# Patient Record
Sex: Male | Born: 1950 | Race: White | Hispanic: No | Marital: Married | Smoking: Never smoker
Health system: Southern US, Community
[De-identification: ages and names within clinical notes are randomized; demographics above are authoritative.]

## PROBLEM LIST (undated history)

## (undated) HISTORY — PX: KNEE ARTHROSCOPY: SUR90

## (undated) HISTORY — PX: TONSILLECTOMY: SUR1361

## (undated) HISTORY — PX: CATARACT EXTRACTION: SUR2

---

## 2021-06-20 DIAGNOSIS — C642 Malignant neoplasm of left kidney, except renal pelvis: Secondary | ICD-10-CM

## 2021-06-20 HISTORY — DX: Malignant neoplasm of left kidney, except renal pelvis: C64.2

## 2021-11-17 ENCOUNTER — Other Ambulatory Visit: Payer: Self-pay | Admitting: Neurological Surgery

## 2021-11-17 ENCOUNTER — Ambulatory Visit
Admission: RE | Admit: 2021-11-17 | Discharge: 2021-11-17 | Disposition: A | Discharge status: Home / Self Care | Payer: Medicare Other | Source: Ambulatory Visit | Attending: Neurological Surgery | Admitting: Neurological Surgery

## 2021-11-17 DIAGNOSIS — M4850XS Collapsed vertebra, not elsewhere classified, site unspecified, sequela of fracture: Secondary | ICD-10-CM

## 2021-11-18 ENCOUNTER — Other Ambulatory Visit: Payer: Self-pay | Admitting: Neurological Surgery

## 2021-11-18 NOTE — Pre-Procedure Instructions (Signed)
Surgical Instructions    Your procedure is scheduled on 11/23/21.  Report to [facility] Main Entrance "A" at [email or web address] then check in with the Admitting office.  Call this number if you have problems the morning of surgery:  [phone]   If you have any questions prior to your surgery date call [phone]: Open Monday-Friday 8am-4pm    Remember:  Do not eat or drink after midnight the night before your surgery    Take these medicines the morning of surgery with A SIP OF WATER:  N/A  As of today, STOP taking any Aspirin (unless otherwise instructed by your surgeon) Aleve, Naproxen, Ibuprofen, Motrin, Advil, Goody's, BC's, all herbal medications, fish oil, and all vitamins.           Do not wear jewelry or makeup Do not wear lotions, powders, perfumes/colognes, or deodorant. Do not shave 48 hours prior to surgery.  Men may shave face and neck. Do not bring valuables to the hospital. Do not wear nail polish, gel polish, artificial nails, or any other type of covering on natural nails (fingers and toes) If you have artificial nails or gel coating that need to be removed by a nail salon, please have this removed prior to surgery. Artificial nails or gel coating may interfere with anesthesia's ability to adequately monitor your vital signs.  [facility] is not responsible for any belongings or valuables. .   Do NOT Smoke (Tobacco/Vaping)  24 hours prior to your procedure  If you use a CPAP at night, you may bring your mask for your overnight stay.   Contacts, glasses, hearing aids, dentures or partials may not be worn into surgery, please bring cases for these belongings   For patients admitted to the hospital, discharge time will be determined by your treatment team.   Patients discharged the day of surgery will not be allowed to drive home, and someone needs to stay with them for 24 hours.   SURGICAL WAITING ROOM VISITATION Patients having surgery or a procedure in a hospital  may have two support people. Children under the age of 27 must have an adult with them who is not the patient. They may stay in the waiting area during the procedure and may switch out with other visitors. If the patient needs to stay at the hospital during part of their recovery, the visitor guidelines for inpatient rooms apply.  Please refer to the [facility] website for the visitor guidelines for Inpatients (after your surgery is over and you are in a regular room).    Special instructions:    Oral Hygiene is also important to reduce your risk of infection.  Remember - BRUSH YOUR TEETH THE MORNING OF SURGERY WITH YOUR REGULAR TOOTHPASTE   - Preparing For Surgery  Before surgery, you can play an important role. Because skin is not sterile, your skin needs to be as free of germs as possible. You can reduce the number of germs on your skin by washing with CHG (chlorahexidine gluconate) Soap before surgery.  CHG is an antiseptic cleaner which kills germs and bonds with the skin to continue killing germs even after washing.     Please do not use if you have an allergy to CHG or antibacterial soaps. If your skin becomes reddened/irritated stop using the CHG.  Do not shave (including legs and underarms) for at least 48 hours prior to first CHG shower. It is OK to shave your face.  Please follow these instructions carefully.  Shower the NIGHT BEFORE SURGERY and the MORNING OF SURGERY with CHG Soap.   If you chose to wash your hair, wash your hair first as usual with your normal shampoo. After you shampoo, rinse your hair and body thoroughly to remove the shampoo.  Then [employer] and genitals (private parts) with your normal soap and rinse thoroughly to remove soap.  After that Use CHG Soap as you would any other liquid soap. You can apply CHG directly to the skin and wash gently with a scrungie or a clean washcloth.   Apply the CHG Soap to your body ONLY FROM THE NECK DOWN.  Do  not use on open wounds or open sores. Avoid contact with your eyes, ears, mouth and genitals (private parts). Wash Face and genitals (private parts)  with your normal soap.   Wash thoroughly, paying special attention to the area where your surgery will be performed.  Thoroughly rinse your body with warm water from the neck down.  DO NOT shower/wash with your normal soap after using and rinsing off the CHG Soap.  Pat yourself dry with a CLEAN TOWEL.  Wear CLEAN PAJAMAS to bed the night before surgery  Place CLEAN SHEETS on your bed the night before your surgery  DO NOT SLEEP WITH PETS.   Day of Surgery:  Take a shower with CHG soap. Wear Clean/Comfortable clothing the morning of surgery Do not apply any deodorants/lotions.   Remember to brush your teeth WITH YOUR REGULAR TOOTHPASTE.   If you received a COVID test during your pre-op visit, it is requested that you wear a mask when out in public, stay away from anyone that may not be feeling well, and notify your surgeon if you develop symptoms. If you have been in contact with anyone that has tested positive in the last 10 days, please notify your surgeon.    Please read over the following fact sheets that you were given.

## 2021-11-19 ENCOUNTER — Encounter (HOSPITAL_COMMUNITY)
Admission: RE | Admit: 2021-11-19 | Discharge: 2021-11-19 | Disposition: A | Discharge status: Home / Self Care | Payer: Medicare Other | Source: Ambulatory Visit | Attending: Neurological Surgery | Admitting: Neurological Surgery

## 2021-11-19 ENCOUNTER — Other Ambulatory Visit: Payer: Self-pay

## 2021-11-19 ENCOUNTER — Encounter (HOSPITAL_COMMUNITY): Payer: Self-pay

## 2021-11-19 VITALS — BP 156/84 | HR 58 | Temp 98.3°F | Resp 17 | Ht 70.0 in | Wt 188.6 lb

## 2021-11-19 DIAGNOSIS — I10 Essential (primary) hypertension: Secondary | ICD-10-CM | POA: Insufficient documentation

## 2021-11-19 DIAGNOSIS — Z01818 Encounter for other preprocedural examination: Secondary | ICD-10-CM | POA: Diagnosis present

## 2021-11-19 LAB — BASIC METABOLIC PANEL
Anion gap: 11 (ref 5–15)
BUN: 27 mg/dL — ABNORMAL HIGH (ref 8–23)
CO2: 26 mmol/L (ref 22–32)
Calcium: 9.3 mg/dL (ref 8.9–10.3)
Chloride: 98 mmol/L (ref 98–111)
Creatinine, Ser: 1.23 mg/dL (ref 0.61–1.24)
GFR, Estimated: >60 mL/min (ref >60)
Glucose, Bld: 148 mg/dL — ABNORMAL HIGH (ref 70–99)
Potassium: 4 mmol/L (ref 3.5–5.1)
Sodium: 135 mmol/L (ref 135–145)

## 2021-11-19 LAB — TYPE AND SCREEN
ABO/RH(D): O POS
Antibody Screen: NEGATIVE

## 2021-11-19 LAB — CBC
HCT: 42.1 % (ref 39.0–52.0)
Hemoglobin: 14 g/dL (ref 13.0–17.0)
MCH: 28.7 pg (ref 26.0–34.0)
MCHC: 33.3 g/dL (ref 30.0–36.0)
MCV: 86.4 fL (ref 80.0–100.0)
Platelets: 352 10*3/uL (ref 150–400)
RBC: 4.87 MIL/uL (ref 4.22–5.81)
RDW: 13.3 % (ref 11.5–15.5)
WBC: 21.2 10*3/uL — ABNORMAL HIGH (ref 4.0–10.5)
nRBC: 0 % (ref 0.0–0.2)

## 2021-11-19 LAB — SURGICAL PCR SCREEN
MRSA, PCR: NEGATIVE
Staphylococcus aureus: NEGATIVE

## 2021-11-19 NOTE — Pre-Procedure Instructions (Signed)
Surgical Instructions    Your procedure is scheduled on 11/23/21.  Report to [facility] Main Entrance "A" at [place] then check in with the Admitting office.  Call this number if you have problems the morning of surgery:  [phone]   If you have any questions prior to your surgery date call [phone]: Open Monday-Friday 8am-4pm    Remember:  Do not eat or drink after midnight the night before your surgery    Take these medicines the morning of surgery with A SIP OF WATER:   If needed: HYDROcodone-acetaminophen (NORCO/VICODIN)  As of today, STOP taking any Aspirin (unless otherwise instructed by your surgeon) Aleve, Naproxen, Ibuprofen, Motrin, Advil, Goody's, BC's, all herbal medications, fish oil, and all vitamins.           Do not wear jewelry or makeup Do not wear lotions, powders, perfumes/colognes, or deodorant. Do not shave 48 hours prior to surgery.  Men may shave face and neck. Do not bring valuables to the hospital. Do not wear nail polish, gel polish, artificial nails, or any other type of covering on natural nails (fingers and toes) If you have artificial nails or gel coating that need to be removed by a nail salon, please have this removed prior to surgery. Artificial nails or gel coating may interfere with anesthesia's ability to adequately monitor your vital signs.  [facility] is not responsible for any belongings or valuables. .   Do NOT Smoke (Tobacco/Vaping)  24 hours prior to your procedure  If you use a CPAP at night, you may bring your mask for your overnight stay.   Contacts, glasses, hearing aids, dentures or partials may not be worn into surgery, please bring cases for these belongings   For patients admitted to the hospital, discharge time will be determined by your treatment team.   Patients discharged the day of surgery will not be allowed to drive home, and someone needs to stay with them for 24 hours.   SURGICAL WAITING ROOM  VISITATION Patients having surgery or a procedure in a hospital may have two support people. Children under the age of 36 must have an adult with them who is not the patient. They may stay in the waiting area during the procedure and may switch out with other visitors. If the patient needs to stay at the hospital during part of their recovery, the visitor guidelines for inpatient rooms apply.  Please refer to the [facility] website for the visitor guidelines for Inpatients (after your surgery is over and you are in a regular room).    Special instructions:    Oral Hygiene is also important to reduce your risk of infection.  Remember - BRUSH YOUR TEETH THE MORNING OF SURGERY WITH YOUR REGULAR TOOTHPASTE   [facility]- Preparing For Surgery  Before surgery, you can play an important role. Because skin is not sterile, your skin needs to be as free of germs as possible. You can reduce the number of germs on your skin by washing with CHG (chlorahexidine gluconate) Soap before surgery.  CHG is an antiseptic cleaner which kills germs and bonds with the skin to continue killing germs even after washing.     Please do not use if you have an allergy to CHG or antibacterial soaps. If your skin becomes reddened/irritated stop using the CHG.  Do not shave (including legs and underarms) for at least 48 hours prior to first CHG shower. It is OK to shave your face.  Please follow  these instructions carefully.     Shower the NIGHT BEFORE SURGERY and the MORNING OF SURGERY with CHG Soap.   If you chose to wash your hair, wash your hair first as usual with your normal shampoo. After you shampoo, rinse your hair and body thoroughly to remove the shampoo.  Then [employer] and genitals (private parts) with your normal soap and rinse thoroughly to remove soap.  After that Use CHG Soap as you would any other liquid soap. You can apply CHG directly to the skin and wash gently with a scrungie or a clean washcloth.    Apply the CHG Soap to your body ONLY FROM THE NECK DOWN.  Do not use on open wounds or open sores. Avoid contact with your eyes, ears, mouth and genitals (private parts). Wash Face and genitals (private parts)  with your normal soap.   Wash thoroughly, paying special attention to the area where your surgery will be performed.  Thoroughly rinse your body with warm water from the neck down.  DO NOT shower/wash with your normal soap after using and rinsing off the CHG Soap.  Pat yourself dry with a CLEAN TOWEL.  Wear CLEAN PAJAMAS to bed the night before surgery  Place CLEAN SHEETS on your bed the night before your surgery  DO NOT SLEEP WITH PETS.   Day of Surgery:  Take a shower with CHG soap. Wear Clean/Comfortable clothing the morning of surgery Do not apply any deodorants/lotions.   Remember to brush your teeth WITH YOUR REGULAR TOOTHPASTE.   If you received a COVID test during your pre-op visit, it is requested that you wear a mask when out in public, stay away from anyone that may not be feeling well, and notify your surgeon if you develop symptoms. If you have been in contact with anyone that has tested positive in the last 10 days, please notify your surgeon.    Please read over the following fact sheets that you were given.

## 2021-11-19 NOTE — Progress Notes (Signed)
PCP - Dr. Wyona Almas [place] Cardiologist - denies  PPM/ICD - denies   Chest x-ray - denies EKG - 11/19/21 Stress Test - denies ECHO - denies Cardiac Cath - denies  Sleep Study - denies  DM- denies  ASA/Blood Thinner Instructions: n/a   ERAS Protcol - no, NPO   COVID TEST- n/a   Anesthesia review: no  Patient denies shortness of breath, fever, cough and chest pain at PAT appointment   All instructions explained to the patient, with a verbal understanding of the material. Patient agrees to go over the instructions while at home for a better understanding. Patient also instructed to notify surgeon of any contact with COVID+ person or if he develops any symptoms. The opportunity to ask questions was provided.

## 2021-11-23 ENCOUNTER — Other Ambulatory Visit: Payer: Self-pay

## 2021-11-23 ENCOUNTER — Inpatient Hospital Stay (HOSPITAL_COMMUNITY): Payer: Medicare Other

## 2021-11-23 ENCOUNTER — Inpatient Hospital Stay (HOSPITAL_COMMUNITY): Payer: Medicare Other | Admitting: Certified Registered Nurse Anesthetist

## 2021-11-23 ENCOUNTER — Encounter (HOSPITAL_COMMUNITY): Payer: Self-pay | Admitting: Neurological Surgery

## 2021-11-23 ENCOUNTER — Encounter (HOSPITAL_COMMUNITY)
Admission: RE | Disposition: A | Discharge status: Home / Self Care | Payer: Self-pay | Source: Home / Self Care | Attending: Neurological Surgery

## 2021-11-23 ENCOUNTER — Inpatient Hospital Stay (HOSPITAL_COMMUNITY)
Admission: RE | Admit: 2021-11-23 | Discharge: 2021-11-25 | DRG: 457 | Disposition: A | Discharge status: Home / Self Care | Payer: Medicare Other | Attending: Neurological Surgery | Admitting: Neurological Surgery

## 2021-11-23 DIAGNOSIS — M8448XA Pathological fracture, other site, initial encounter for fracture: Secondary | ICD-10-CM | POA: Diagnosis present

## 2021-11-23 DIAGNOSIS — M8458XA Pathological fracture in neoplastic disease, other specified site, initial encounter for fracture: Secondary | ICD-10-CM | POA: Diagnosis present

## 2021-11-23 DIAGNOSIS — M4854XA Collapsed vertebra, not elsewhere classified, thoracic region, initial encounter for fracture: Secondary | ICD-10-CM | POA: Diagnosis not present

## 2021-11-23 DIAGNOSIS — Z85528 Personal history of other malignant neoplasm of kidney: Secondary | ICD-10-CM | POA: Diagnosis not present

## 2021-11-23 DIAGNOSIS — R29890 Loss of height: Secondary | ICD-10-CM | POA: Diagnosis present

## 2021-11-23 DIAGNOSIS — Z79899 Other long term (current) drug therapy: Secondary | ICD-10-CM

## 2021-11-23 DIAGNOSIS — D497 Neoplasm of unspecified behavior of endocrine glands and other parts of nervous system: Secondary | ICD-10-CM | POA: Diagnosis present

## 2021-11-23 DIAGNOSIS — C7951 Secondary malignant neoplasm of bone: Secondary | ICD-10-CM | POA: Diagnosis present

## 2021-11-23 DIAGNOSIS — G9529 Other cord compression: Secondary | ICD-10-CM | POA: Diagnosis present

## 2021-11-23 DIAGNOSIS — I10 Essential (primary) hypertension: Secondary | ICD-10-CM | POA: Diagnosis present

## 2021-11-23 DIAGNOSIS — M4804 Spinal stenosis, thoracic region: Secondary | ICD-10-CM | POA: Diagnosis present

## 2021-11-23 LAB — ABO/RH: ABO/RH(D): O POS

## 2021-11-23 SURGERY — POSTERIOR THORACIC FUSION 4 LEVELS
Anesthesia: General

## 2021-11-23 MED ORDER — METHOCARBAMOL 500 MG PO TABS
500.0000 mg | ORAL_TABLET | Freq: Four times a day (QID) | ORAL | Status: DC | PRN
  Administered 2021-11-23: 500 mg via ORAL
  Filled 2021-11-23: qty 1

## 2021-11-23 MED ORDER — GLYCOPYRROLATE PF 0.2 MG/ML IJ SOSY
PREFILLED_SYRINGE | INTRAMUSCULAR | Status: AC
  Filled 2021-11-23: qty 2

## 2021-11-23 MED ORDER — MENTHOL 3 MG MT LOZG
1.0000 | LOZENGE | OROMUCOSAL | Status: DC | PRN
Start: 2021-11-23 — End: 2021-11-25

## 2021-11-23 MED ORDER — SODIUM CHLORIDE 0.9% FLUSH: 3.0000 mL | INTRAVENOUS | Status: DC | PRN

## 2021-11-23 MED ORDER — CHLORHEXIDINE GLUCONATE CLOTH 2 % EX PADS: 6.0000 | MEDICATED_PAD | Freq: Once | CUTANEOUS | Status: DC

## 2021-11-23 MED ORDER — ROCURONIUM BROMIDE 10 MG/ML (PF) SYRINGE
PREFILLED_SYRINGE | INTRAVENOUS | Status: DC | PRN
  Administered 2021-11-23: 30 mg via INTRAVENOUS
  Administered 2021-11-23: 50 mg via INTRAVENOUS
  Administered 2021-11-23: 70 mg via INTRAVENOUS

## 2021-11-23 MED ORDER — LACTATED RINGERS IV SOLN: INTRAVENOUS | Status: DC

## 2021-11-23 MED ORDER — THROMBIN 20000 UNITS EX SOLR
CUTANEOUS | Status: DC | PRN
  Administered 2021-11-23: 20 mL via TOPICAL

## 2021-11-23 MED ORDER — KETAMINE HCL 50 MG/5ML IJ SOSY
PREFILLED_SYRINGE | INTRAMUSCULAR | Status: AC
Start: 2021-11-23 — End: ?
  Filled 2021-11-23: qty 10

## 2021-11-23 MED ORDER — PHENYLEPHRINE HCL-NACL 20-0.9 MG/250ML-% IV SOLN
INTRAVENOUS | Status: DC | PRN
  Administered 2021-11-23: 25 ug/min via INTRAVENOUS

## 2021-11-23 MED ORDER — ACETAMINOPHEN 325 MG PO TABS
650.0000 mg | ORAL_TABLET | Freq: Once | ORAL | Status: AC
  Administered 2021-11-23: 650 mg via ORAL

## 2021-11-23 MED ORDER — BUPIVACAINE-EPINEPHRINE 0.5% -1:200000 IJ SOLN
INTRAMUSCULAR | Status: AC
  Filled 2021-11-23: qty 1

## 2021-11-23 MED ORDER — ACETAMINOPHEN 650 MG RE SUPP: 650.0000 mg | RECTAL | Status: DC | PRN

## 2021-11-23 MED ORDER — CHLORHEXIDINE GLUCONATE 0.12 % MT SOLN
15.0000 mL | Freq: Once | OROMUCOSAL | Status: AC
  Administered 2021-11-23: 15 mL via OROMUCOSAL
  Filled 2021-11-23: qty 15

## 2021-11-23 MED ORDER — ONDANSETRON HCL 4 MG/2ML IJ SOLN
INTRAMUSCULAR | Status: DC | PRN
  Administered 2021-11-23 (×2): 4 mg via INTRAVENOUS

## 2021-11-23 MED ORDER — ACETAMINOPHEN 325 MG PO TABS
ORAL_TABLET | ORAL | Status: AC
  Filled 2021-11-23: qty 2

## 2021-11-23 MED ORDER — SODIUM CHLORIDE 0.9% FLUSH
3.0000 mL | Freq: Two times a day (BID) | INTRAVENOUS | Status: DC
  Administered 2021-11-23 – 2021-11-25 (×4): 3 mL via INTRAVENOUS

## 2021-11-23 MED ORDER — HYDROMORPHONE HCL 1 MG/ML IJ SOLN: 0.5000 mg | INTRAMUSCULAR | Status: DC | PRN

## 2021-11-23 MED ORDER — OXYCODONE HCL 5 MG PO TABS
10.0000 mg | ORAL_TABLET | ORAL | Status: DC | PRN
  Filled 2021-11-23: qty 2

## 2021-11-23 MED ORDER — LIDOCAINE 2% (20 MG/ML) 5 ML SYRINGE
INTRAMUSCULAR | Status: AC
  Filled 2021-11-23: qty 5

## 2021-11-23 MED ORDER — EPHEDRINE 5 MG/ML INJ
INTRAVENOUS | Status: AC
Start: 2021-11-23 — End: ?
  Filled 2021-11-23: qty 5

## 2021-11-23 MED ORDER — CEFAZOLIN SODIUM-DEXTROSE 2-4 GM/100ML-% IV SOLN
2.0000 g | INTRAVENOUS | Status: AC
  Administered 2021-11-23: 2 g via INTRAVENOUS
  Filled 2021-11-23: qty 100

## 2021-11-23 MED ORDER — CHLORTHALIDONE 25 MG PO TABS
25.0000 mg | ORAL_TABLET | Freq: Every day | ORAL | Status: DC
  Administered 2021-11-24 – 2021-11-25 (×2): 25 mg via ORAL
  Filled 2021-11-23 (×2): qty 1

## 2021-11-23 MED ORDER — LIDOCAINE-EPINEPHRINE 1 %-1:100000 IJ SOLN
INTRAMUSCULAR | Status: DC | PRN
  Administered 2021-11-23: 5 mL

## 2021-11-23 MED ORDER — SODIUM CHLORIDE 0.9 % IV SOLN: 250.0000 mL | INTRAVENOUS | Status: DC

## 2021-11-23 MED ORDER — BUPIVACAINE LIPOSOME 1.3 % IJ SUSP
INTRAMUSCULAR | Status: AC
  Filled 2021-11-23: qty 20

## 2021-11-23 MED ORDER — PHENOL 1.4 % MT LIQD: 1.0000 | OROMUCOSAL | Status: DC | PRN

## 2021-11-23 MED ORDER — 0.9 % SODIUM CHLORIDE (POUR BTL) OPTIME
TOPICAL | Status: DC | PRN
  Administered 2021-11-23: 1000 mL

## 2021-11-23 MED ORDER — LIDOCAINE-EPINEPHRINE 1 %-1:100000 IJ SOLN
INTRAMUSCULAR | Status: AC
  Filled 2021-11-23: qty 1

## 2021-11-23 MED ORDER — LIDOCAINE 2% (20 MG/ML) 5 ML SYRINGE
INTRAMUSCULAR | Status: DC | PRN
  Administered 2021-11-23: 60 mg via INTRAVENOUS

## 2021-11-23 MED ORDER — BUPIVACAINE-EPINEPHRINE 0.5% -1:200000 IJ SOLN
INTRAMUSCULAR | Status: DC | PRN
  Administered 2021-11-23: 5 mL

## 2021-11-23 MED ORDER — FENTANYL CITRATE (PF) 250 MCG/5ML IJ SOLN
INTRAMUSCULAR | Status: AC
  Filled 2021-11-23: qty 5

## 2021-11-23 MED ORDER — THROMBIN 20000 UNITS EX SOLR
CUTANEOUS | Status: AC
  Filled 2021-11-23: qty 20000

## 2021-11-23 MED ORDER — SODIUM CHLORIDE 0.9 % IV SOLN
0.0500 ug/kg/min | INTRAVENOUS | Status: AC
  Filled 2021-11-23: qty 5000

## 2021-11-23 MED ORDER — KETOROLAC TROMETHAMINE 15 MG/ML IJ SOLN
7.5000 mg | Freq: Four times a day (QID) | INTRAMUSCULAR | Status: AC
  Administered 2021-11-23 – 2021-11-24 (×4): 7.5 mg via INTRAVENOUS
  Filled 2021-11-23 (×4): qty 1

## 2021-11-23 MED ORDER — THROMBIN 5000 UNITS EX SOLR
CUTANEOUS | Status: AC
  Filled 2021-11-23: qty 5000

## 2021-11-23 MED ORDER — ONDANSETRON HCL 4 MG PO TABS: 4.0000 mg | ORAL_TABLET | Freq: Four times a day (QID) | ORAL | Status: DC | PRN

## 2021-11-23 MED ORDER — LACTATED RINGERS IV SOLN: INTRAVENOUS | Status: DC | PRN

## 2021-11-23 MED ORDER — EPHEDRINE SULFATE (PRESSORS) 50 MG/ML IJ SOLN
INTRAMUSCULAR | Status: DC | PRN
  Administered 2021-11-23: 5 mg via INTRAVENOUS

## 2021-11-23 MED ORDER — PHENYLEPHRINE 80 MCG/ML (10ML) SYRINGE FOR IV PUSH (FOR BLOOD PRESSURE SUPPORT)
PREFILLED_SYRINGE | INTRAVENOUS | Status: AC
Start: 2021-11-23 — End: ?
  Filled 2021-11-23: qty 10

## 2021-11-23 MED ORDER — DEXAMETHASONE SODIUM PHOSPHATE 10 MG/ML IJ SOLN
INTRAMUSCULAR | Status: AC
  Filled 2021-11-23: qty 2

## 2021-11-23 MED ORDER — THROMBIN 5000 UNITS EX SOLR
OROMUCOSAL | Status: DC | PRN
  Administered 2021-11-23: 5 mL via TOPICAL

## 2021-11-23 MED ORDER — SUGAMMADEX SODIUM 200 MG/2ML IV SOLN
INTRAVENOUS | Status: DC | PRN
  Administered 2021-11-23: 200 mg via INTRAVENOUS

## 2021-11-23 MED ORDER — MIDAZOLAM HCL 2 MG/2ML IJ SOLN
INTRAMUSCULAR | Status: DC | PRN
  Administered 2021-11-23: 2 mg via INTRAVENOUS

## 2021-11-23 MED ORDER — PROPOFOL 10 MG/ML IV BOLUS
INTRAVENOUS | Status: DC | PRN
  Administered 2021-11-23: 100 mg via INTRAVENOUS

## 2021-11-23 MED ORDER — ONDANSETRON HCL 4 MG/2ML IJ SOLN: 4.0000 mg | Freq: Four times a day (QID) | INTRAMUSCULAR | Status: DC | PRN

## 2021-11-23 MED ORDER — ACETAMINOPHEN 500 MG PO TABS: 1000.0000 mg | ORAL_TABLET | Freq: Once | ORAL | Status: DC

## 2021-11-23 MED ORDER — ORAL CARE MOUTH RINSE: 15.0000 mL | Freq: Once | OROMUCOSAL | Status: AC

## 2021-11-23 MED ORDER — PHENYLEPHRINE 80 MCG/ML (10ML) SYRINGE FOR IV PUSH (FOR BLOOD PRESSURE SUPPORT)
PREFILLED_SYRINGE | INTRAVENOUS | Status: DC | PRN
  Administered 2021-11-23: 80 ug via INTRAVENOUS

## 2021-11-23 MED ORDER — GLYCOPYRROLATE 0.2 MG/ML IJ SOLN
INTRAMUSCULAR | Status: DC | PRN
  Administered 2021-11-23: .2 mg via INTRAVENOUS

## 2021-11-23 MED ORDER — DOCUSATE SODIUM 100 MG PO CAPS
100.0000 mg | ORAL_CAPSULE | Freq: Two times a day (BID) | ORAL | Status: DC
  Administered 2021-11-24 – 2021-11-25 (×3): 100 mg via ORAL
  Filled 2021-11-23 (×3): qty 1

## 2021-11-23 MED ORDER — ROCURONIUM BROMIDE 10 MG/ML (PF) SYRINGE
PREFILLED_SYRINGE | INTRAVENOUS | Status: AC
  Filled 2021-11-23: qty 40

## 2021-11-23 MED ORDER — HYDROMORPHONE HCL 1 MG/ML IJ SOLN
INTRAMUSCULAR | Status: AC
  Filled 2021-11-23: qty 0.5

## 2021-11-23 MED ORDER — KETAMINE HCL 10 MG/ML IJ SOLN
INTRAMUSCULAR | Status: DC | PRN
  Administered 2021-11-23 (×2): 10 mg via INTRAVENOUS
  Administered 2021-11-23: 40 mg via INTRAVENOUS

## 2021-11-23 MED ORDER — PANTOPRAZOLE SODIUM 40 MG IV SOLR
40.0000 mg | Freq: Every day | INTRAVENOUS | Status: DC
  Administered 2021-11-23 – 2021-11-24 (×2): 40 mg via INTRAVENOUS
  Filled 2021-11-23 (×2): qty 10

## 2021-11-23 MED ORDER — ONDANSETRON HCL 4 MG/2ML IJ SOLN
INTRAMUSCULAR | Status: AC
Start: 2021-11-23 — End: ?
  Filled 2021-11-23: qty 2

## 2021-11-23 MED ORDER — METHOCARBAMOL 1000 MG/10ML IJ SOLN
500.0000 mg | Freq: Four times a day (QID) | INTRAVENOUS | Status: DC | PRN
  Filled 2021-11-23: qty 5

## 2021-11-23 MED ORDER — HYDROMORPHONE HCL 1 MG/ML IJ SOLN
INTRAMUSCULAR | Status: DC | PRN
  Administered 2021-11-23 (×2): .5 mg via INTRAVENOUS

## 2021-11-23 MED ORDER — DEXAMETHASONE SODIUM PHOSPHATE 10 MG/ML IJ SOLN
INTRAMUSCULAR | Status: DC | PRN
  Administered 2021-11-23: 10 mg via INTRAVENOUS

## 2021-11-23 MED ORDER — FENTANYL CITRATE (PF) 250 MCG/5ML IJ SOLN
INTRAMUSCULAR | Status: DC | PRN
  Administered 2021-11-23: 100 ug via INTRAVENOUS
  Administered 2021-11-23 (×3): 50 ug via INTRAVENOUS

## 2021-11-23 MED ORDER — CEFAZOLIN SODIUM-DEXTROSE 2-4 GM/100ML-% IV SOLN
2.0000 g | Freq: Three times a day (TID) | INTRAVENOUS | Status: AC
  Administered 2021-11-23 – 2021-11-24 (×2): 2 g via INTRAVENOUS
  Filled 2021-11-23 (×2): qty 100

## 2021-11-23 MED ORDER — DEXAMETHASONE 4 MG PO TABS
4.0000 mg | ORAL_TABLET | Freq: Three times a day (TID) | ORAL | Status: DC
  Administered 2021-11-23 – 2021-11-25 (×5): 4 mg via ORAL
  Filled 2021-11-23 (×5): qty 1

## 2021-11-23 MED ORDER — ACETAMINOPHEN 325 MG PO TABS
650.0000 mg | ORAL_TABLET | ORAL | Status: DC | PRN
  Administered 2021-11-23: 650 mg via ORAL
  Filled 2021-11-23: qty 2

## 2021-11-23 MED ORDER — BUPIVACAINE LIPOSOME 1.3 % IJ SUSP
INTRAMUSCULAR | Status: DC | PRN
  Administered 2021-11-23: 20 mL

## 2021-11-23 MED ORDER — PROPOFOL 10 MG/ML IV BOLUS
INTRAVENOUS | Status: AC
  Filled 2021-11-23: qty 20

## 2021-11-23 MED ORDER — ONDANSETRON HCL 4 MG/2ML IJ SOLN
INTRAMUSCULAR | Status: AC
  Filled 2021-11-23: qty 2

## 2021-11-23 MED ORDER — MIDAZOLAM HCL 2 MG/2ML IJ SOLN
INTRAMUSCULAR | Status: AC
Start: 2021-11-23 — End: ?
  Filled 2021-11-23: qty 2

## 2021-11-23 MED ORDER — OXYCODONE HCL 5 MG PO TABS
5.0000 mg | ORAL_TABLET | ORAL | Status: DC | PRN
  Administered 2021-11-23 – 2021-11-24 (×2): 5 mg via ORAL
  Filled 2021-11-23: qty 1

## 2021-11-23 MED ORDER — ATENOLOL-CHLORTHALIDONE 50-25 MG PO TABS: 1.0000 | ORAL_TABLET | Freq: Every day | ORAL | Status: DC

## 2021-11-23 MED ORDER — ATENOLOL 25 MG PO TABS
50.0000 mg | ORAL_TABLET | Freq: Every day | ORAL | Status: DC
  Administered 2021-11-24 – 2021-11-25 (×2): 50 mg via ORAL
  Filled 2021-11-23 (×2): qty 2

## 2021-11-23 SURGICAL SUPPLY — 78 items
BAG BANDED W/RUBBER/TAPE 36X54 (MISCELLANEOUS) ×2 IMPLANT
BAG COUNTER SPONGE SURGICOUNT (BAG) ×3 IMPLANT
BAND RUBBER #18 3X1/16 STRL (MISCELLANEOUS) ×2 IMPLANT
BASKET BONE COLLECTION (BASKET) ×1 IMPLANT
BLADE BONE MILL MEDIUM (MISCELLANEOUS) ×1 IMPLANT
BUR CARBIDE MATCH 3.0 (BURR) ×2 IMPLANT
CARTRIDGE OIL MAESTRO DRILL (MISCELLANEOUS) ×1 IMPLANT
CNTNR URN SCR LID CUP LEK RST (MISCELLANEOUS) ×1 IMPLANT
CONT SPEC 4OZ STRL OR WHT (MISCELLANEOUS) ×2
COVER BACK TABLE 60X90IN (DRAPES) ×2 IMPLANT
COVER MAYO STAND STRL (DRAPES) ×1 IMPLANT
DECANTER SPIKE VIAL GLASS SM (MISCELLANEOUS) ×2 IMPLANT
DERMABOND ADVANCED (GAUZE/BANDAGES/DRESSINGS) ×1
DERMABOND ADVANCED .7 DNX12 (GAUZE/BANDAGES/DRESSINGS) ×1 IMPLANT
DIFFUSER DRILL AIR PNEUMATIC (MISCELLANEOUS) ×1 IMPLANT
DRAIN JACKSON RD 7FR 3/32 (WOUND CARE) IMPLANT
DRAPE C-ARM 42X72 X-RAY (DRAPES) ×1 IMPLANT
DRAPE C-ARMOR (DRAPES) ×1 IMPLANT
DRAPE LAPAROTOMY 100X72X124 (DRAPES) ×2 IMPLANT
DRAPE MICROSCOPE LEICA (MISCELLANEOUS) ×1 IMPLANT
DRAPE SURG 17X23 STRL (DRAPES) ×2 IMPLANT
DRSG OPSITE 4X5.5 SM (GAUZE/BANDAGES/DRESSINGS) ×2 IMPLANT
DRSG OPSITE POSTOP 4X10 (GAUZE/BANDAGES/DRESSINGS) ×1 IMPLANT
DRSG OPSITE POSTOP 4X8 (GAUZE/BANDAGES/DRESSINGS) IMPLANT
DRSG PAD ABDOMINAL 8X10 ST (GAUZE/BANDAGES/DRESSINGS) ×1 IMPLANT
DURAPREP 26ML APPLICATOR (WOUND CARE) ×2 IMPLANT
ELECT BLADE INSULATED 4IN (ELECTROSURGICAL) ×2
ELECT REM PT RETURN 9FT ADLT (ELECTROSURGICAL) ×2
ELECTRODE BLADE INSULATED 4IN (ELECTROSURGICAL) ×1 IMPLANT
ELECTRODE REM PT RTRN 9FT ADLT (ELECTROSURGICAL) ×1 IMPLANT
EVACUATOR 1/8 PVC DRAIN (DRAIN) ×1 IMPLANT
GAUZE 4X4 16PLY ~~LOC~~+RFID DBL (SPONGE) IMPLANT
GAUZE SPONGE 4X4 12PLY STRL (GAUZE/BANDAGES/DRESSINGS) ×2 IMPLANT
GAUZE SPONGE 4X4 12PLY STRL LF (GAUZE/BANDAGES/DRESSINGS) ×1 IMPLANT
GLOVE BIOGEL PI IND STRL 8 (GLOVE) ×2 IMPLANT
GLOVE BIOGEL PI INDICATOR 8 (GLOVE) ×2
GLOVE ECLIPSE 8.0 STRL XLNG CF (GLOVE) ×4 IMPLANT
GLOVE SURG ENC MOIS LTX SZ8 (GLOVE) ×2 IMPLANT
GLOVE SURG UNDER POLY LF SZ8.5 (GLOVE) ×2 IMPLANT
GOWN STRL REUS W/ TWL LRG LVL3 (GOWN DISPOSABLE) IMPLANT
GOWN STRL REUS W/ TWL XL LVL3 (GOWN DISPOSABLE) ×2 IMPLANT
GOWN STRL REUS W/TWL 2XL LVL3 (GOWN DISPOSABLE) IMPLANT
GOWN STRL REUS W/TWL LRG LVL3 (GOWN DISPOSABLE) ×2
GOWN STRL REUS W/TWL XL LVL3 (GOWN DISPOSABLE) ×3
HEMOSTAT POWDER SURGIFOAM 1G (HEMOSTASIS) ×1 IMPLANT
KIT BASIN OR (CUSTOM PROCEDURE TRAY) ×2 IMPLANT
KIT POSITION SURG JACKSON T1 (MISCELLANEOUS) ×1 IMPLANT
KIT TURNOVER KIT B (KITS) ×2 IMPLANT
MARKER SKIN DUAL TIP RULER LAB (MISCELLANEOUS) ×2 IMPLANT
MILL BONE PREP (MISCELLANEOUS) ×1 IMPLANT
NDL HYPO 21X1.5 ECLIPSE (NEEDLE) ×1 IMPLANT
NDL HYPO 25X1 1.5 SAFETY (NEEDLE) ×1 IMPLANT
NEEDLE HYPO 21X1.5 ECLIPSE (NEEDLE) ×2 IMPLANT
NEEDLE HYPO 25X1 1.5 SAFETY (NEEDLE) ×2 IMPLANT
NS IRRIG 1000ML POUR BTL (IV SOLUTION) ×2 IMPLANT
OIL CARTRIDGE MAESTRO DRILL (MISCELLANEOUS)
PACK LAMINECTOMY NEURO (CUSTOM PROCEDURE TRAY) ×2 IMPLANT
PAD ARMBOARD 7.5X6 YLW CONV (MISCELLANEOUS) ×6 IMPLANT
PATTIES SURGICAL .5 X.5 (GAUZE/BANDAGES/DRESSINGS) IMPLANT
PATTIES SURGICAL .5 X1 (DISPOSABLE) IMPLANT
PATTIES SURGICAL 1X1 (DISPOSABLE) IMPLANT
PUTTY DBM PROPEL MEDIUM (Putty) ×1 IMPLANT
ROD RELINE-O 5.5X120MM LORD (Rod) ×2 IMPLANT
SCREW LOCK RELINE 5.5 TULIP (Screw) ×8 IMPLANT
SCREW RELINE 5.5X35 POLYAXIAL (Screw) ×4 IMPLANT
SCREW RELINE-O POLY 5.5X45MM (Screw) ×4 IMPLANT
SPONGE SURGIFOAM ABS GEL 100 (HEMOSTASIS) ×2 IMPLANT
SPONGE T-LAP 4X18 ~~LOC~~+RFID (SPONGE) IMPLANT
STAPLER VISISTAT 35W (STAPLE) ×2 IMPLANT
SUT VIC AB 0 CT1 27 (SUTURE) ×1
SUT VIC AB 0 CT1 27XBRD ANBCTR (SUTURE) ×1 IMPLANT
SUT VIC AB 2-0 CP2 18 (SUTURE) ×4 IMPLANT
SUT VIC AB 3-0 SH 8-18 (SUTURE) ×3 IMPLANT
SYR 20ML LL LF (SYRINGE) ×2 IMPLANT
TOWEL GREEN STERILE (TOWEL DISPOSABLE) IMPLANT
TOWEL GREEN STERILE FF (TOWEL DISPOSABLE) IMPLANT
TRAY FOLEY MTR SLVR 16FR STAT (SET/KITS/TRAYS/PACK) ×2 IMPLANT
WATER STERILE IRR 1000ML POUR (IV SOLUTION) ×2 IMPLANT

## 2021-11-23 NOTE — Anesthesia Procedure Notes (Signed)
Procedure Name: Intubation Date/Time: 11/23/2021 12:18 PM Performed by: Inda Coke, CRNA Pre-anesthesia Checklist: Patient identified, Emergency Drugs available, Suction available and Patient being monitored Patient Re-evaluated:Patient Re-evaluated prior to induction Oxygen Delivery Method: Circle System Utilized Preoxygenation: Pre-oxygenation with 100% oxygen Induction Type: IV induction Ventilation: Mask ventilation without difficulty Laryngoscope Size: Mac and 4 Tube type: Oral Tube size: 7.0 mm Number of attempts: 1 Airway Equipment and Method: Stylet Placement Confirmation: ETT inserted through vocal cords under direct vision, positive ETCO2 and breath sounds checked- equal and bilateral Secured at: 22 cm Tube secured with: Tape Dental Injury: Teeth and Oropharynx as per pre-operative assessment

## 2021-11-23 NOTE — H&P (Signed)
    Providing Compassionate, Quality Care - Together  NEUROSURGERY HISTORY & PHYSICAL   Zavier Canela is an 71 y.o. male.   Chief Complaint: Upper back pain, pathologic fracture HPI: This is a 71 year old male, with past medical history of hypertension, was complaining of thoracic back pain and underwent MRI.  MRI revealed pathologic fracture due to metastatic disease with a renal lesion concerning for metastatic renal cell carcinoma.  There was severe stenosis on MRI at T4 due to epidural tumor invasion.  He denied any numbness tingling or weakness in his legs.  He presents today for surgical intervention for decompression, separation surgery and instrumentation and fusion T2-T6.  Past Medical History:  Diagnosis Date   Renal carcinoma, left ([place]) 2023    Past Surgical History:  Procedure Laterality Date   CATARACT EXTRACTION Bilateral    KNEE ARTHROSCOPY Bilateral    pt states these were done around 2000   TONSILLECTOMY     removed as a child    History reviewed. No pertinent family history. Social History:  reports that he has never smoked. He has never used smokeless tobacco. He reports that he does not currently use alcohol. He reports that he does not use drugs.  Allergies: No Known Allergies  Medications Prior to Admission  Medication Sig Dispense Refill   atenolol-chlorthalidone (TENORETIC) 50-25 MG tablet Take 1 tablet by mouth daily.     dexamethasone (DECADRON) 4 MG tablet Take 4 mg by mouth every 8 (eight) hours.     HYDROcodone-acetaminophen (NORCO/VICODIN) 5-325 MG tablet Take 1 tablet by mouth 3 (three) times daily as needed for pain.      Results for orders placed or performed during the hospital encounter of 11/23/21 (from the past 48 hour(s))  ABO/Rh     Status: None   Collection Time: 11/23/21  9:51 AM  Result Value Ref Range   ABO/RH(D)      O POS Performed at [facility] [address], [place], [place] [address]    No results  found.  ROS All pertinent positives and negatives are listed in HPI above  Blood pressure (!) 141/82, pulse 60, temperature 97.7 F (36.5 C), resp. rate 17, height '5\' 10"'$  (1.778 m), weight 86.2 kg, SpO2 98 %. Physical Exam  Awake alert oriented x3, no acute distress Speech fluent and appropriate Cranial nerves II through XII intact Nonlabored breathing Bilateral upper/lower extremities full strength throughout and symmetric Sensory intact light touch throughout Hyperreflexic in bilateral lower extremities 3/4 patellar and Achilles, no clonus  Assessment/Plan 71 year old male with  T4 pathologic fracture with approximately 50% loss of height with severe stenosis  -OR today for T3-5 laminectomy, decompression, T4 transpedicular decompression, with T2-T6 instrumentation and fusion.  All risks, benefits and expected outcomes were discussed and agreed upon.  Informed consent was obtained.  We will obtain specimen and sent for permanent pathology given this is a new diagnosis and he has not had specimen obtained at this point.  Thank you for allowing me to participate in this patient's care.  Please do not hesitate to call with questions or concerns.   Elwin Sleight, [facility] Cell: [phone]

## 2021-11-23 NOTE — Op Note (Signed)
Providing Compassionate, Quality Care - Together  Date of service: 11/23/2021  PREOP DIAGNOSIS:  T4 pathologic compression fracture, with severe stenosis and epidural compression  POSTOP DIAGNOSIS: Same  PROCEDURE: T4 transpedicular decompression, right, for resection of epidural tumor T2-3, T3-4, T4-5, T5-6 posterolateral fusion Bilateral segmental pedicle screw instrumentation T2, T3, T5, T6; NuVasive pedicle screws (T2: 5.5 x 35 mm bilaterally, T3: 5.5 x 35 mm bilaterally, T5: 5.5 x 45 mm bilaterally, T6: 5.5 x 45 mm bilaterally) T3, T4, T5 bilateral laminectomy for decompression of neural elements; right T3-4 facetectomy Intraoperative use of autograft, same incision intraoperative use of allograft Intraoperative use of microscope for microdissection Intraoperative use of fluoroscopy, greater than 1 hour  SURGEON: Dr. Pieter Partridge C. Arley Salamone, DO  ASSISTANT: Dr. Duffy Rhody, MD  ANESTHESIA: General Endotracheal  EBL: 150 cc  SPECIMENS: T4 epidural tumor  DRAINS: Medium Hemovac  COMPLICATIONS: None  CONDITION: Hemodynamically stable  HISTORY: Jeremy Chase is a 71 y.o. male complaining of thoracic back pain, imaging revealed a pathologic fracture at T4 with severe stenosis and approximately 50% loss of height.  There is also a left renal lesion concerning for renal cell carcinoma.  Due to his severe stenosis and likely renal cell pathology, I recommended surgical intervention in the form of a T2-6 instrumentation and fusion T4 transpedicular decompression and separation surgery.  All risks, benefits and expected outcomes were discussed and agreed upon.  Informed consent was obtained.  PROCEDURE IN DETAIL: The patient was brought to the operating room. After induction of general anesthesia, the patient was positioned on the operative table in the prone position. All pressure points were meticulously padded. Skin incision was then marked out using AP fluoroscopy and prepped and  draped in the usual sterile fashion. Physician driven timeout was performed.  Using a 10 blade, sharp incision was made over the T2, T3, T4, T5, T6 spinous processes.  Using Bovie electrocautery, soft tissue dissection was performed down to the posterior thoracic fascia.  Subperiosteal dissection was then performed bilaterally to expose the T2 lamina and transverse process, T3 lamina and transverse process, T4 lamina and transverse process, T5 lamina transverse process, T6 lamina transverse process.  Deep retractor assembly.  AP and lateral fluoroscopy confirmed appropriate level.  Using a high-speed drill, pilot hole was created along the TP facet junction for a T2 pedicle.  Pedicle probe was used to access the pedicle bilaterally.  AP and lateral fluoroscopy confirmed appropriate placement.  Using ball-tipped feeler there were bony borders in all directions.  Using a 4.5 mm tap, the trajectory was tapped and again felt with a ball-tipped probe.  There were bony borders in all directions.  The above listed pedicle screw was placed with appropriate purchase.  This was repeated bilaterally at T3, T5, T6.  AP and lateral fluoroscopy confirmed appropriate placement.    The microscope was then sterilely draped and brought into the field.  I then remove the spinous processes of T3, T4-T5.  Autograft was saved for later use.  I then used a high-speed drill to perform a bilateral laminectomy at T3, a right T3 for complete facetectomy, and a bilateral laminectomy at T4 and the superior portion bilaterally at T5 down to the ligamentum flavum.  Bone was saved for autograft and use later.  Using microcurette, the epidural space was dissected freely from the ligamentum flavum.  This was then completely resected at T3, T4, T5.  The lateral thecal sac was identified on the right after the facetectomy as well  as the exiting nerve root.  Using the high-speed drill, the right T4 pedicle was followed down and removed to the  dorsal vertebral body.  Epidural tumor was identified and gently dissected.  The ventral epidural space was clearly stenotic.  The epidural tumor was then removed with pituitary and Kerrison rongeurs and sent for permanent pathology.  I then tamped down the remaining posteriorly protruding pathologic fracture portion.  I felt superiorly and inferiorly and there was no epidural compression noted.  Along the left side I gently retracted the thecal sac medially and removed the small amount of epidural tumor with pituitary rongeur.  Epidural hemostasis was achieved with Surgifoam.  The thecal sac was noted to be appropriately pulsatile and no longer stenotic.  The T2-3, T3-4 (left), T4-5, T5-6 joints were decorticated and autograft and allograft was packed laterally bilaterally.  The appropriate size rods were then measured, contoured and placed.  Setscrews were placed and final tightened to the manufacturer's recommendation.  I then removed the retractors and soft tissue hemostasis was achieved with bipolar cautery.  Wound was then closed in layers with 0 Vicryl sutures for muscle and fascia.  Dermis was closed with 2-0 and 3-0 Vicryl sutures.  Skin was closed with skin glue.  Sterile dressing applied.  At the end of the case all sponge, needle, and instrument counts were correct. The patient was then transferred to the stretcher, extubated, and taken to the post-anesthesia care unit in stable hemodynamic condition.

## 2021-11-23 NOTE — Plan of Care (Signed)
  Problem: Education: Goal: Knowledge of General Education information will improve Description: Including pain rating scale, medication(s)/side effects and non-pharmacologic comfort measures Outcome: Progressing   Problem: Clinical Measurements: Goal: Ability to maintain clinical measurements within normal limits will improve Outcome: Progressing   

## 2021-11-23 NOTE — Anesthesia Preprocedure Evaluation (Signed)
Anesthesia Evaluation  Patient identified by MRN, date of birth, ID band Patient awake    Reviewed: Allergy & Precautions, NPO status , Patient's Chart, lab work & pertinent test results  Airway Mallampati: II  TM Distance: >3 FB Neck ROM: Full    Dental  (+) Dental Advisory Given   Pulmonary neg pulmonary ROS,    breath sounds clear to auscultation       Cardiovascular hypertension, Pt. on home beta blockers and Pt. on medications  Rhythm:Regular Rate:Normal     Neuro/Psych negative neurological ROS     GI/Hepatic negative GI ROS, Neg liver ROS,   Endo/Other  negative endocrine ROS  Renal/GU Renal disease     Musculoskeletal   Abdominal   Peds  Hematology negative hematology ROS (+)   Anesthesia Other Findings   Reproductive/Obstetrics                             Lab Results  Component Value Date   WBC 21.2 (H) 11/19/2021   HGB 14.0 11/19/2021   HCT 42.1 11/19/2021   MCV 86.4 11/19/2021   PLT 352 11/19/2021   Lab Results  Component Value Date   CREATININE 1.23 11/19/2021   BUN 27 (H) 11/19/2021   NA 135 11/19/2021   K 4.0 11/19/2021   CL 98 11/19/2021   CO2 26 11/19/2021    Anesthesia Physical Anesthesia Plan  ASA: 2  Anesthesia Plan: General   Post-op Pain Management: Tylenol PO (pre-op)* and Minimal or no pain anticipated   Induction: Intravenous  PONV Risk Score and Plan: 2 and Dexamethasone, Ondansetron and Treatment may vary due to age or medical condition  Airway Management Planned: Oral ETT  Additional Equipment: None  Intra-op Plan:   Post-operative Plan: Extubation in OR  Informed Consent: I have reviewed the patients History and Physical, chart, labs and discussed the procedure including the risks, benefits and alternatives for the proposed anesthesia with the patient or authorized representative who has indicated his/her understanding and acceptance.      Dental advisory given  Plan Discussed with: CRNA  Anesthesia Plan Comments:         Anesthesia Quick Evaluation

## 2021-11-23 NOTE — Transfer of Care (Signed)
Immediate Anesthesia Transfer of Care Note  Patient: Jeremy Chase  Procedure(s) Performed: OPEN THORACIC LAMINECTOMY THORACIC THREE-THORACIC FIVE THORACIC FOUR TRANSPEDICULAR DECOMPESSION, INSTRUMENTATION AND FUSION THORACIC TWO-THORACIC SIX  Patient Location: PACU  Anesthesia Type:General  Level of Consciousness: awake and drowsy  Airway & Oxygen Therapy: Patient Spontanous Breathing and Patient connected to nasal cannula oxygen  Post-op Assessment: Report given to RN and Post -op Vital signs reviewed and stable  Post vital signs: Reviewed and stable  Last Vitals:  Vitals Value Taken Time  BP 105/71 11/23/21 1631  Temp    Pulse 45 11/23/21 1635  Resp 16 11/23/21 1635  SpO2 93 % 11/23/21 1635  Vitals shown include unvalidated device data.  Last Pain:  Vitals:   11/23/21 0940  PainSc: 2       Patients Stated Pain Goal: 3 (32/76/14 7092)  Complications: No notable events documented.

## 2021-11-24 NOTE — Plan of Care (Signed)

## 2021-11-24 NOTE — Plan of Care (Signed)

## 2021-11-24 NOTE — Evaluation (Signed)
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 Pt underwent decompression, separation surgery, & instrumentiation and fusion T2-6 on 6/6.   Clinical Impression  Pt admitted with above. Pt functioning well POD 1. Pt educated on back precautions with verbal understanding and is functioning at min guard with use of RW. Pt reports good home set up and 24/7 support. Suspect pt to progress well. If pt feels he isn't back to his normal function at follow up appt then he is to ask MD about outpt PT. Acute PT to cont to follow.       Recommendations for follow up therapy are one component of a multi-disciplinary discharge planning process, led by the attending physician.  Recommendations may be updated based on patient status, additional functional criteria and insurance authorization.  Follow Up Recommendations Outpatient PT (once cleared by MD)    Assistance Recommended at Discharge Frequent or constant Supervision/Assistance  Patient can return home with the following  A little help with walking and/or transfers;A little help with bathing/dressing/bathroom;Assist for transportation    Equipment Recommendations Rolling walker (2 wheels)  Recommendations for Other Services       Functional Status Assessment Patient has had a recent decline in their functional status and demonstrates the ability to make significant improvements in function in a reasonable and predictable amount of time.     Precautions / Restrictions Precautions Precautions: Back Precaution Booklet Issued: No (OT to bring) Precaution Comments: pt educated on BLT and not raising hands above  head Required Braces or Orthoses:  (no brace needed) Restrictions Weight Bearing Restrictions: No      Mobility  Bed Mobility Overal bed mobility: Needs Assistance Bed Mobility: Rolling, Sidelying to Sit Rolling: Min guard Sidelying to sit: Min guard       General bed mobility comments: max directional cues for technique, increased time but no physical assist needed, HOB at 20 deg    Transfers Overall transfer level: Needs assistance Equipment used: Rolling walker (2 wheels) Transfers: Sit to/from Stand Sit to Stand: Min guard           General transfer comment: verbal cues for safe hand placement, increased time, guarded due to fear of onset of pain    Ambulation/Gait Ambulation/Gait assistance: Min guard Gait Distance (Feet): 160 Feet Assistive device: Rolling walker (2 wheels) Gait Pattern/deviations: Step-through pattern, Decreased stride length Gait velocity: slow Gait velocity interpretation: 1.31 - 2.62 ft/sec, indicative of limited community ambulator   General Gait Details: pt reports significant decrease in pain and states "I feel great. So much better than that bed." Attempt to amb without RW and pt stated "I feel much better with the RW, more stable." Pt with fluid gait pattern and no instability or report of dizziness.  Stairs            Wheelchair Mobility    Modified Rankin (Stroke Patients Only)       Balance Overall balance assessment: Needs assistance Sitting-balance support: Feet supported, No upper extremity supported Sitting balance-Leahy Scale: Good     Standing balance support: During functional activity, Bilateral upper extremity supported, Reliant on assistive device for balance Standing balance-Leahy Scale: Fair Standing balance comment: requires  use of RW for safe ambulation at this time                             Pertinent Vitals/Pain Pain Assessment Pain Assessment: 0-10 Pain Score: 8  (8/10 upon arrival  while in bed, 4/10 s/p amb, c/o soreness not as much pain) Pain Location: surgical incision/site Pain Descriptors / Indicators: Sore Pain Intervention(s): Monitored during session    Home Living Family/patient expects to be discharged to:: Private residence Living Arrangements: Spouse/significant other (may go live with one of his children) Available Help at Discharge: Family;Available 24 hours/day Type of Home: House Home Access: Stairs to enter Entrance Stairs-Rails: None Entrance Stairs-Number of Steps: 2   Home Layout: One level Home Equipment: None Additional Comments: pt states he may stay with one of kids because they have a walk in shower    Prior Function Prior Level of Function : Independent/Modified Independent             Mobility Comments: no AD, was driving ADLs Comments: indep     Hand Dominance   Dominant Hand: Right    Extremity/Trunk Assessment   Upper Extremity Assessment Upper Extremity Assessment: Overall WFL for tasks assessed    Lower Extremity Assessment Lower Extremity Assessment: Overall WFL for tasks assessed    Cervical / Trunk Assessment Cervical / Trunk Assessment: Back Surgery  Communication   Communication: No difficulties  Cognition Arousal/Alertness: Awake/alert Behavior During Therapy: WFL for tasks assessed/performed Overall Cognitive Status: Within Functional Limits for tasks assessed                                 General Comments: pt educated on back precautions        General Comments General comments (skin integrity, edema, etc.): incision covered by honeycomb and has a hemovac    Exercises     Assessment/Plan    PT Assessment Patient needs continued PT services  PT Problem List Decreased strength;Decreased balance;Decreased activity tolerance;Decreased mobility;Decreased coordination       PT Treatment Interventions DME instruction;Gait training;Stair training;Functional mobility  training;Therapeutic activities;Therapeutic exercise;Balance training;Neuromuscular re-education    PT Goals (Current goals can be found in the Care Plan section)  Acute Rehab PT Goals Patient Stated Goal: home PT Goal Formulation: With patient Time For Goal Achievement: 12/08/21 Potential to Achieve Goals: Good    Frequency Min 4X/week     Co-evaluation               AM-PAC PT "6 Clicks" Mobility  Outcome Measure Help needed turning from your back to your side while in a flat bed without using bedrails?: A Little Help needed moving from lying on your back to sitting on the side of a flat bed without using bedrails?: A Little Help needed moving to and from a bed to a chair (including a wheelchair)?: A Little Help needed standing up from a chair using your arms (e.g., wheelchair or bedside chair)?: A Little Help needed to walk in hospital room?: A Little Help needed climbing 3-5 steps with a railing? : A Little 6 Click Score: 18    End of Session Equipment Utilized During Treatment: Gait belt Activity Tolerance: Patient tolerated treatment well Patient left: in chair;with call bell/phone within reach;with chair alarm set Nurse Communication: Mobility status PT Visit Diagnosis: Muscle weakness (generalized) (M62.81);Difficulty in walking, not elsewhere classified (R26.2)  Time: 0800-0828 PT Time Calculation (min) (ACUTE ONLY): 28 min   Charges:   PT Evaluation $PT Eval Moderate Complexity: 1 Mod PT Treatments $Gait Training: 8-22 mins        Kittie Plater, PT, DPT Acute Rehabilitation Services Secure chat preferred Office #: [phone]   Berline Lopes 11/24/2021, 9:46 AM

## 2021-11-24 NOTE — TOC Initial Note (Signed)
Transition of Care [facility]) - Initial/Assessment Note    Patient Details  Name: Jeremy Chase MRN: [id number] Date of Birth: 1951/01/26  Transition of Care [facility]) CM/SW Contact:    Pollie Friar, RN Phone Number: 11/24/2021, 2:45 PM  Clinical Narrative:                 Patient is from home with his spouse. She is able to provide needed supervision. At discharge they are planning to stay at their daughters home as she has walk in shower.  PT recommending outpatient therapy. Pt will follow up with surgery appointment prior to this being arranged.  Recommendation for walker for home. Pt in agreement. CM will have delivered to the room on day of d/c.  Pt manages his own medications and denies any issues.  Wife is able to provide needed transportation. TOC following.  Expected Discharge Plan: Home/Self Care Barriers to Discharge: Continued Medical Work up   Patient Goals and CMS Choice        Expected Discharge Plan and Services Expected Discharge Plan: Home/Self Care   Discharge Planning Services: CM Consult Post Acute Care Choice: Durable Medical Equipment Living arrangements for the past 2 months: Single Family Home                 DME Arranged: Walker rolling DME Agency: [employer]                  Prior Living Arrangements/Services Living arrangements for the past 2 months: Single Family Home Lives with:: Spouse Patient language and need for interpreter reviewed:: Yes Do you feel safe going back to the place where you live?: Yes      Need for Family Participation in Patient Care: Yes (Comment) Care giver support system in place?: Yes (comment)   Criminal Activity/Legal Involvement Pertinent to Current Situation/Hospitalization: No - Comment as needed  Activities of Daily Living Home Assistive Devices/Equipment: None ADL Screening (condition at time of admission) Patient's cognitive ability adequate to safely complete daily activities?: Yes Is the patient  deaf or have difficulty hearing?: No Does the patient have difficulty seeing, even when wearing glasses/contacts?: No Does the patient have difficulty concentrating, remembering, or making decisions?: No Patient able to express need for assistance with ADLs?: Yes Does the patient have difficulty dressing or bathing?: No Independently performs ADLs?: Yes (appropriate for developmental age) Does the patient have difficulty walking or climbing stairs?: No Weakness of Legs: None Weakness of Arms/Hands: None  Permission Sought/Granted                  Emotional Assessment Appearance:: Appears stated age Attitude/Demeanor/Rapport: Engaged Affect (typically observed): Accepting Orientation: : Oriented to Self, Oriented to Place, Oriented to  Time, Oriented to Situation   Psych Involvement: No (comment)  Admission diagnosis:  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

## 2021-11-24 NOTE — Progress Notes (Signed)
Subjective: Patient reports that he is doing well overall and is pleased with his postoperative status thus far. He has appropriate surgical pain. No acute events overnight.  Objective: Vital signs in last 24 hours: Temp:  [97.3 F (36.3 C)-98.8 F (37.1 C)] 98.8 F (37.1 C) (06/07 1121) Pulse Rate:  [58-84] 64 (06/07 1121) Resp:  [13-20] 16 (06/07 1121) BP: (97-122)/(69-84) 118/71 (06/07 1121) SpO2:  [93 %-99 %] 98 % (06/07 1121)  Intake/Output from previous day: 06/06 0701 - 06/07 0700 In: 2000 [I.V.:2000] Out: 1450 [Urine:1225; Drains:75; Blood:150] Intake/Output this shift: No intake/output data recorded.  Physical Exam: Patient is awake, A/O X 4, conversant, and in good spirits. Eyes open spontaneously. They are in NAD and VSS. Doing well. Speech is fluent and appropriate. MAEW with good strength that is symmetric bilaterally.  BUE 5/5 throughout, BLE 5/5 throughout. Sensation to light touch is intact. PERLA, EOMI. CNs grossly intact. +3 patellar and achilles reflexes bilaterally. No clonus. Dressing is clean dry intact. Incision is well approximated with no drainage, erythema, or edema. Hemovac with approximately 150 ml of sanguineous  drainage.  Lab Results: No results for input(s): WBC, HGB, HCT, PLT in the last 72 hours. BMET No results for input(s): NA, K, CL, CO2, GLUCOSE, BUN, CREATININE, CALCIUM in the last 72 hours.  Studies/Results: DG Thoracic Spine 2 View  Result Date: 11/23/2021 CLINICAL DATA:  T3-T5 decompression, T2-T6 fusion. EXAM: THORACIC SPINE 2 VIEWS COMPARISON:  10/27/2021 FINDINGS: Two fluoroscopic images are obtained during the performance of the procedure and are provided for interpretation only. Images demonstrate posterior fusion T2-T6, without screws at T4. Fluoroscopy time: 1 minute, 36 seconds 30.44 mGy IMPRESSION: Expected intraoperative appearance of T2-T6 fusion and T3-T5 decompression. Electronically Signed   By: Merilyn Baba M.D.   On: 11/23/2021  15:55   DG C-Arm 1-60 Min-No Report  Result Date: 11/23/2021 Fluoroscopy was utilized by the requesting physician.  No radiographic interpretation.   DG C-Arm 1-60 Min-No Report  Result Date: 11/23/2021 Fluoroscopy was utilized by the requesting physician.  No radiographic interpretation.   DG C-Arm 1-60 Min-No Report  Result Date: 11/23/2021 Fluoroscopy was utilized by the requesting physician.  No radiographic interpretation.    Assessment/Plan: Patient is post-op day 1 s/p T3-5 laminectomy, decompression, T4 transpedicular decompression, with T2-T6 instrumentation and fusion. He is recovering well and reports a resolution a reduction in his preoperative pain. His only complaint is mild incisional discomfort that appears appropriate. He has ambulated with nursing staff. He is awaiting PT/OT evaluation.  No brace is needed. Continue working on pain control, mobility and ambulating patient. Will continue Hemovac and reassess readiness for removal in the morning. Potential discharge Thursday or Friday.  -PT/OT -Continue Hemovac -Pain control -Encourage mobility    LOS: 1 day     Marvis Moeller, DNP, AGNP-C Neurosurgery Nurse Practitioner  [facility] [address], [address], [place], [place] [address] P: [phone]    F: [phone]  11/24/2021 12:18 PM

## 2021-11-24 NOTE — Evaluation (Signed)
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 Pt underwent decompression, separation surgery, & instrumentiation and fusion T2-6 on 6/6.   Clinical Impression   Pt independent at baseline with ADLs and functional mobility, lives with spouse, plans to d/c to daughter's home. Pt currently requiring mod I -min guard for ADLs, and min guard -supervision for transfers with and without RW. Pt educated on compensatory strategies for LB dressing, precautions, grooming, and demo'd tub shower transfer for pt for when he returns to his home from daughter's home. Pt verbalized understanding, adheres well to precautions throughout session. Pt presenting with impairments listed below, will follow acutely. Recommend d/c home with family assistance.     Recommendations for follow up therapy are one component of a multi-disciplinary discharge planning process, led by the attending physician.  Recommendations may be updated based on patient status, additional functional criteria and insurance authorization.   Follow Up Recommendations  No OT follow up    Assistance Recommended at Discharge Set up Supervision/Assistance  Patient can return home with the following Assistance with cooking/housework;Assist for transportation;Help with stairs or ramp for entrance;A little help with bathing/dressing/bathroom    Functional Status Assessment  Patient has had a recent decline in their functional status and demonstrates the ability to make significant improvements in function in a reasonable and predictable amount of time.  Equipment Recommendations  None recommended by OT (pt has all  needed DME)    Recommendations for Other Services       Precautions / Restrictions Precautions Precautions: Back Precaution Booklet Issued: Yes (comment) Precaution Comments: educated pt on 3/3 back precautions Required Braces or Orthoses:  (no brace needed per MD) Restrictions Weight Bearing Restrictions: No      Mobility Bed Mobility               General bed mobility comments: OOB in chair upon arrival    Transfers Overall transfer level: Needs assistance Equipment used: Rolling walker (2 wheels) Transfers: Sit to/from Stand Sit to Stand: Min guard                  Balance Overall balance assessment: Needs assistance Sitting-balance support: Feet supported, No upper extremity supported Sitting balance-Leahy Scale: Good Sitting balance - Comments: can perform figure 4 for LB dressing without LOB   Standing balance support: During functional activity, Bilateral upper extremity supported, Reliant on assistive device for balance Standing balance-Leahy Scale: Fair Standing balance comment: requires use of RW for safe ambulation at this time                           ADL either performed or assessed with clinical judgement   ADL Overall ADL's : Needs assistance/impaired Eating/Feeding: Modified independent;Sitting   Grooming: Modified independent;Sitting;Standing   Upper Body Bathing: Sitting;Min guard   Lower Body Bathing: Sitting/lateral leans;Adhering to back precautions;Min guard   Upper Body Dressing : Sitting;Min guard   Lower Body Dressing: Min guard;Sit to/from stand;Sitting/lateral leans;Adhering to back precautions Lower Body Dressing Details (indicate cue type and reason): to don pants/socks Toilet Transfer: Supervision/safety;Ambulation;Regular Toilet;Rolling walker (2 wheels) Toilet Transfer Details (indicate cue type and reason): trialed  with and without RW Toileting- Clothing Manipulation and Hygiene:  Supervision/safety;Sitting/lateral lean;Sit to/from stand       Functional mobility during ADLs: Supervision/safety;Rolling walker (2 wheels)       Vision   Vision Assessment?: No apparent visual deficits     Perception     Praxis      Pertinent Vitals/Pain Pain Assessment Pain Assessment: Faces Pain Score: 2  Faces Pain Scale: Hurts a little bit Pain Location: surgical incision/site Pain Descriptors / Indicators: Sore Pain Intervention(s): Limited activity within patient's tolerance, Monitored during session     Hand Dominance Right   Extremity/Trunk Assessment Upper Extremity Assessment Upper Extremity Assessment: Overall WFL for tasks assessed   Lower Extremity Assessment Lower Extremity Assessment: Defer to PT evaluation   Cervical / Trunk Assessment Cervical / Trunk Assessment: Back Surgery   Communication Communication Communication: No difficulties   Cognition Arousal/Alertness: Awake/alert Behavior During Therapy: WFL for tasks assessed/performed Overall Cognitive Status: Within Functional Limits for tasks assessed                                       General Comments  VSS on RA, spouse present in room    Exercises     Shoulder Instructions      Home Living Family/patient expects to be discharged to:: Private residence Living Arrangements: Spouse/significant other Available Help at Discharge: Family;Available 24 hours/day Type of Home: House Home Access: Stairs to enter [employer] of Steps: 2 Entrance Stairs-Rails: None Home Layout: One level     Bathroom Shower/Tub: Teacher, early years/pre: Standard     Home Equipment: None   Additional Comments: pt plans to stay with daughter who has walk in shower and shower seat built in      Prior Functioning/Environment Prior Level of Function : Independent/Modified Independent             Mobility Comments: no AD, was driving ADLs Comments:  indep        OT Problem List: Decreased strength;Decreased range of motion;Impaired balance (sitting and/or standing)      OT Treatment/Interventions: Self-care/ADL training;Therapeutic exercise;DME and/or AE instruction;Therapeutic activities;Patient/family education;Balance training    OT Goals(Current goals can be found in the care plan section) Acute Rehab OT Goals Patient Stated Goal: none stated OT Goal Formulation: With patient Time For Goal Achievement: 12/08/21 Potential to Achieve Goals: Good  OT Frequency: Min 2X/week    Co-evaluation              AM-PAC OT "6 Clicks" Daily Activity     Outcome Measure Help from another person eating meals?: None Help from another person taking care of personal grooming?: None Help from another person toileting, which includes using toliet, bedpan, or urinal?: None Help from another person bathing (including washing, rinsing, drying)?: A Little Help from another person to put on and taking off regular upper body clothing?: None Help from another person to put on and taking off regular lower body clothing?: A Little 6 Click Score: 22   End of Session Equipment Utilized During Treatment: Gait belt;Rolling walker (2 wheels) Nurse Communication: Mobility status  Activity Tolerance: Patient tolerated treatment well Patient left: in chair;with call bell/phone within reach;with chair alarm set;with family/visitor present  OT Visit Diagnosis: Unsteadiness on feet (R26.81);Other abnormalities of gait and mobility (R26.89);Muscle weakness (generalized) (M62.81)  Time: 0388-8280 OT Time Calculation (min): 32 min Charges:  OT General Charges $OT Visit: 1 Visit OT Evaluation $OT Eval Moderate Complexity: 1 Mod OT Treatments $Self Care/Home Management : 8-22 mins  Lynnda Child, OTD, OTR/L Acute Rehab [phone] [place] 11/24/2021, 12:08 PM

## 2021-11-24 NOTE — Progress Notes (Signed)
Foley Removed and patient encouraged to ambulate.

## 2021-11-24 NOTE — Anesthesia Postprocedure Evaluation (Signed)
Anesthesia Post Note  Patient: Jamelle Goldston  Procedure(s) Performed: OPEN THORACIC LAMINECTOMY THORACIC THREE-THORACIC FIVE THORACIC FOUR TRANSPEDICULAR DECOMPESSION, INSTRUMENTATION AND FUSION THORACIC TWO-THORACIC SIX     Patient location during evaluation: PACU Anesthesia Type: General Level of consciousness: awake and alert Pain management: pain level controlled Vital Signs Assessment: post-procedure vital signs reviewed and stable Respiratory status: spontaneous breathing, nonlabored ventilation, respiratory function stable and patient connected to nasal cannula oxygen Cardiovascular status: blood pressure returned to baseline and stable Postop Assessment: no apparent nausea or vomiting Anesthetic complications: no   No notable events documented.  Last Vitals:  Vitals:   11/24/21 0438 11/24/21 1121  BP: 103/69 118/71  Pulse: 65 64  Resp:  16  Temp: 36.4 C 37.1 C  SpO2: 98% 98%    Last Pain:  Vitals:   11/24/21 1121  TempSrc: Oral  PainSc:                  Tiajuana Amass

## 2021-11-25 MED ORDER — HYDROCODONE-ACETAMINOPHEN 5-325 MG PO TABS: 1.0000 | ORAL_TABLET | ORAL | 0 refills | Status: AC | PRN

## 2021-11-25 MED ORDER — METHOCARBAMOL 500 MG PO TABS: 500.0000 mg | ORAL_TABLET | Freq: Four times a day (QID) | ORAL | 3 refills | Status: AC | PRN

## 2021-11-25 MED ORDER — METHOCARBAMOL 500 MG PO TABS: 750.0000 mg | ORAL_TABLET | Freq: Four times a day (QID) | ORAL | 3 refills | Status: AC | PRN

## 2021-11-25 NOTE — Plan of Care (Signed)

## 2021-11-25 NOTE — TOC Transition Note (Signed)
Transition of Care [facility]) - CM/SW Discharge Note   Patient Details  Name: Jeremy Chase MRN: [id number] Date of Birth: [birth year]  Transition of Care [facility]) CM/SW Contact:  Pollie Friar, RN Phone Number: 11/25/2021, 10:55 AM   Clinical Narrative:    Patient is discharging home with self care. He will f/u with surgeon office on outpatient therapy at his first follow up appointment.  Walker for home ordered through [place] and will be delivered to the room.  Pt has supervision at home and transportation to home.   Final next level of care: Home/Self Care Barriers to Discharge: No Barriers Identified   Patient Goals and CMS Choice     Choice offered to / list presented to : Patient  Discharge Placement                       Discharge Plan and Services   Discharge Planning Services: CM Consult Post Acute Care Choice: Durable Medical Equipment          DME Arranged: Walker rolling DME Agency: [employer] Date DME Agency Contacted: 11/25/21   Representative spoke with at DME Agency: Jamal Maes            Social Determinants of Health ([place]) Interventions     Readmission Risk Interventions     No data to display

## 2021-11-25 NOTE — Progress Notes (Signed)
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 Pt underwent decompression, separation surgery, & instrumentiation and fusion T2-6 on 6/6.   OT comments  Pt progressing well.  He completes ADLs and mobility in room using RW with modified independence.  Demonstrates good awareness and recall of back precautions during session. Eager for [employer] home, no further questions or concerns.  OT will sign off.    Recommendations for follow up therapy are one component of a multi-disciplinary discharge planning process, led by the attending physician.  Recommendations may be updated based on patient status, additional functional criteria and insurance authorization.    Follow Up Recommendations  No OT follow up    Assistance Recommended at Discharge PRN  Patient can return home with the following  Assistance with cooking/housework;Assist for transportation;Help with stairs or ramp for entrance   Equipment Recommendations  None recommended by OT    Recommendations for Other Services      Precautions / Restrictions Precautions Precautions: Back Precaution Booklet Issued: Yes (comment) Precaution Comments: good recall and adherence to precautions during ADLs and mobility Restrictions Weight Bearing Restrictions: No       Mobility Bed Mobility               General bed mobility comments: OOB in chair upon arrival, verbalizes techniques without cueing    Transfers Overall transfer level: Modified independent Equipment used: Rolling walker (2 wheels)                     Balance Overall balance assessment: Needs assistance Sitting-balance support:  No upper extremity supported, Feet supported Sitting balance-Leahy Scale: Good     Standing balance support: Bilateral upper extremity supported, No upper extremity supported, During functional activity Standing balance-Leahy Scale: Fair                             ADL either performed or assessed with clinical judgement   ADL Overall ADL's : Modified independent                                       General ADL Comments: demonstrates ability to complete LB dressing, grooming, toielt transfers, toileting and shower transfers with modified independence. Able to recall technqiues due to back precautions without cueing    Extremity/Trunk Assessment              Vision       Perception     Praxis      Cognition Arousal/Alertness: Awake/alert Behavior During Therapy: WFL for tasks assessed/performed Overall Cognitive Status: Within Functional Limits for tasks assessed                                          Exercises      Shoulder Instructions       General Comments pt eager for dc home, no further questions or concerns    Pertinent Vitals/ Pain  Pain Assessment Pain Assessment: No/denies pain  Home Living                                          Prior Functioning/Environment              Frequency  Min 2X/week        Progress Toward Goals  OT Goals(current goals can now be found in the care plan section)  Progress towards OT goals: Goals met/education completed, patient discharged from OT  Acute Rehab OT Goals Patient Stated Goal: home today OT Goal Formulation: With patient Time For Goal Achievement: 12/08/21 Potential to Achieve Goals: Good  Plan Discharge plan remains appropriate;Frequency remains appropriate    Co-evaluation                 AM-PAC OT "6 Clicks" Daily Activity     Outcome Measure   Help from another person eating meals?: None Help from  another person taking care of personal grooming?: None Help from another person toileting, which includes using toliet, bedpan, or urinal?: None Help from another person bathing (including washing, rinsing, drying)?: None Help from another person to put on and taking off regular upper body clothing?: None Help from another person to put on and taking off regular lower body clothing?: None 6 Click Score: 24    End of Session Equipment Utilized During Treatment: Rolling walker (2 wheels)  OT Visit Diagnosis: Unsteadiness on feet (R26.81);Other abnormalities of gait and mobility (R26.89);Muscle weakness (generalized) (M62.81)   Activity Tolerance Patient tolerated treatment well   Patient Left in chair;with call bell/phone within reach;with chair alarm set   Nurse Communication Mobility status        Time: 6433-2951 OT Time Calculation (min): 14 min  Charges: OT General Charges $OT Visit: 1 Visit OT Treatments $Self Care/Home Management : 8-22 mins  [place] [facility] [phone]   [place] [facility] 11/25/2021, 11:43 AM

## 2021-11-25 NOTE — Progress Notes (Signed)
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 Pt underwent decompression, separation surgery, & instrumentiation and fusion T2-6 on 6/6.    PT Comments    Pt doing well with mobility and no further PT needed.  Ready for dc from PT standpoint.    Recommendations for follow up therapy are one component of a multi-disciplinary discharge planning process, led by the attending physician.  Recommendations may be updated based on patient status, additional functional criteria and insurance authorization.  Follow Up Recommendations  No PT follow up     Assistance Recommended at Discharge PRN  Patient can return home with the following     Equipment Recommendations  Rolling walker (2 wheels)    Recommendations for Other Services       Precautions / Restrictions Precautions Precautions: Back Precaution Booklet Issued: Yes (comment) Precaution Comments: good recall and adherence to precautions during mobility Restrictions Weight Bearing Restrictions: No     Mobility  Bed Mobility               General bed mobility comments: Up in chair    Transfers Overall transfer level: Modified independent Equipment used: Rolling walker (2 wheels), None                    Ambulation/Gait Ambulation/Gait assistance: Modified independent (Device/Increase time) Gait Distance (Feet): 300 Feet Assistive device: Rolling walker (2 wheels), None Gait Pattern/deviations: Step-through pattern, Drifts right/left Gait velocity: decr Gait velocity interpretation: 1.31 - 2.62 ft/sec, indicative of limited community ambulator   General Gait Details: Pt with  slight drift when not using rolling walker but no gross instability   Stairs Stairs: Yes Stairs assistance: Modified independent (Device/Increase time) Stair Management: Two rails, Alternating pattern, Forwards Number of Stairs: 6     Wheelchair Mobility    Modified Rankin (Stroke Patients Only)       Balance Overall balance assessment: Needs assistance Sitting-balance support: No upper extremity supported, Feet supported Sitting balance-Leahy Scale: Good     Standing balance support: No upper extremity supported, During functional activity Standing balance-Leahy Scale: Good                              Cognition Arousal/Alertness: Awake/alert Behavior During Therapy: WFL for tasks assessed/performed Overall Cognitive Status: Within Functional Limits for tasks assessed                                          Exercises      General Comments General comments (skin integrity, edema, etc.): pt eager for dc home, no further questions or concerns      Pertinent Vitals/Pain Pain Assessment Pain Assessment: No/denies pain    Home Living                          Prior Function            PT Goals (current goals can now be found in the care plan section) Progress towards PT goals: Goals  met/education completed, patient discharged from PT    Frequency           PT Plan Discharge plan needs to be updated    Co-evaluation              AM-PAC PT "6 Clicks" Mobility   Outcome Measure  Help needed turning from your back to your side while in a flat bed without using bedrails?: None Help needed moving from lying on your back to sitting on the side of a flat bed without using bedrails?: None Help needed moving to and from a bed to a chair (including a wheelchair)?: None Help needed standing up from a chair using your arms (e.g., wheelchair or bedside chair)?: None Help needed to walk in hospital room?: None Help  needed climbing 3-5 steps with a railing? : None 6 Click Score: 24    End of Session   Activity Tolerance: Patient tolerated treatment well Patient left: in chair;with call bell/phone within reach;with chair alarm set   PT Visit Diagnosis: Muscle weakness (generalized) (M62.81);Difficulty in walking, not elsewhere classified (R26.2)     Time: 5427-0623 PT Time Calculation (min) (ACUTE ONLY): 9 min  Charges:  $Gait Training: 8-22 mins                     [place] [facility] [place] 11/25/2021, 12:23 PM

## 2021-11-25 NOTE — Discharge Summary (Addendum)
Physician Discharge Summary  Patient ID: Jeremy Chase MRN: [id number] DOB/AGE: 1951-03-18 71 y.o.  Admit date: 11/23/2021 Discharge date: 11/25/2021  Admission Diagnoses: T4 pathologic compression fracture, with severe stenosis and epidural compression  Discharge Diagnoses: T4 pathologic compression fracture, with severe stenosis and epidural compression Principal Problem:   Pathologic fracture of thoracic vertebrae   Discharged Condition: good  Hospital Course: The patient was admitted on 11/23/2021 and taken to the operating room where the patient underwent T3-5 laminectomy, decompression, T4 transpedicular decompression, with T2-T6 instrumentation and fusion. The patient tolerated the procedure well and was taken to the recovery room and then to the floor in stable condition. The hospital course was routine. There were no complications. The wound remained clean dry and intact. Pt had appropriate back soreness. No complaints of leg pain or new N/T/W. The patient remained afebrile with stable vital signs, and tolerated a regular diet. The patient continued to increase activities, and pain was well controlled with oral pain medications.   Consults: None  Significant Diagnostic Studies: radiology: X-Ray: intraoperative   Treatments: surgery:  T4 transpedicular decompression, right, for resection of epidural tumor T2-3, T3-4, T4-5, T5-6 posterolateral fusion Bilateral segmental pedicle screw instrumentation T2, T3, T5, T6; NuVasive pedicle screws (T2: 5.5 x 35 mm bilaterally, T3: 5.5 x 35 mm bilaterally, T5: 5.5 x 45 mm bilaterally, T6: 5.5 x 45 mm bilaterally) T3, T4, T5 bilateral laminectomy for decompression of neural elements; right T3-4 facetectomy Intraoperative use of autograft, same incision intraoperative use of allograft Intraoperative use of microscope for microdissection Intraoperative use of fluoroscopy, greater than 1 hour  Discharge Exam: Blood pressure 136/85, pulse 69,  temperature 98.3 F (36.8 C), temperature source Oral, resp. rate 16, height '5\' 10"'$  (1.778 m), weight 86.2 kg, SpO2 99 %. Physical Exam: Patient is awake, A/O X 4, conversant, and in good spirits. Eyes open spontaneously. They are in NAD and VSS. Doing well. Speech is fluent and appropriate. MAEW with good strength that is symmetric bilaterally.  BUE 5/5 throughout, BLE 5/5 throughout. Sensation to light touch is intact. PERLA, EOMI. CNs grossly intact. +3 patellar and achilles reflexes bilaterally. No clonus. Dressing is clean dry intact. Incision is well approximated with no drainage, erythema, or edema.    Disposition: Discharge disposition: 01-Home or Self Care       Discharge Instructions     Incentive spirometry RT   Complete by: As directed       Allergies as of 11/25/2021   No Known Allergies      Medication List     TAKE these medications    atenolol-chlorthalidone 50-25 MG tablet Commonly known as: TENORETIC Take 1 tablet by mouth daily.   dexamethasone 4 MG tablet Commonly known as: DECADRON Take 4 mg by mouth every 8 (eight) hours.   HYDROcodone-acetaminophen 5-325 MG tablet Commonly known as: NORCO/VICODIN Take 1 tablet by mouth 3 (three) times daily as needed for pain. What changed: Another medication with the same name was added. Make sure you understand how and when to take each.   HYDROcodone-acetaminophen 5-325 MG tablet Commonly known as: NORCO/VICODIN Take 1-2 tablets by mouth every 4 (four) hours as needed for moderate pain. What changed: You were already taking a medication with the same name, and this prescription was added. Make sure you understand how and when to take each.   HYDROcodone-acetaminophen 5-325 MG tablet Commonly known as: NORCO/VICODIN Take 1-2 tablets by mouth every 4 (four) hours as needed for moderate pain. What changed: You were  already taking a medication with the same name, and this prescription was added. Make sure you  understand how and when to take each.   methocarbamol 500 MG tablet Commonly known as: ROBAXIN Take 1.5 tablets (750 mg total) by mouth every 6 (six) hours as needed for muscle spasms.   methocarbamol 500 MG tablet Commonly known as: ROBAXIN Take 1 tablet (500 mg total) by mouth every 6 (six) hours as needed for muscle spasms.               Durable Medical Equipment  (From admission, onward)           Start     Ordered   11/24/21 1307  For home use only DME Walker rolling  Once       Question Answer Comment  Walker: With [place] Wheels   Patient needs a walker to treat with the following condition Status post thoracic spinal fusion      11/24/21 1306             Signed: Marvis Moeller, DNP, AGNP-C Neurosurgery Nurse Practitioner  [facility] [address], [place], [place], [place] [address] P: [phone]    F: [phone]  11/25/2021 10:42 AM

## 2021-11-25 NOTE — Progress Notes (Signed)
Discharged to home after IV accesses removed, JP drain removed, and surgical incision dressing changed, and discharge instructions reviewed with pt.  All questions answered.  No complaints.

## 2021-11-26 LAB — SURGICAL PATHOLOGY

## 2021-11-26 MED FILL — Sodium Chloride IV Soln 0.9%: INTRAVENOUS | Qty: 1000 | Status: AC

## 2021-11-26 MED FILL — Heparin Sodium (Porcine) Inj 1000 Unit/ML: INTRAMUSCULAR | Qty: 30 | Status: AC

## 2022-02-18 DEATH — deceased

## 2023-07-15 IMAGING — CT CT T SPINE W/O CM
3 of 5 series · 9 of 33 positions shown, 10 images · non-contrast
Comparison: MRI thoracic spine 11/07/2021.

CLINICAL DATA: Pathologic T4 fracture in the setting of metastatic
left renal cell carcinoma

EXAM:
CT THORACIC SPINE WITHOUT CONTRAST
TECHNIQUE: Multidetector CT images of the thoracic were obtained using the
standard protocol without intravenous contrast.
RADIATION DOSE REDUCTION: This exam was performed according to the
departmental dose-optimization program which includes automated
exposure control, adjustment of the mA and/or kV according to
patient size and/or use of iterative reconstruction technique.

[Series 3: t-spine 2.00 br40 s3 · axial · 0.31mm/px · z∈[-921,-921]mm · 1 of 172 slices shown, 2 images]
[im 86/172  soft-tissue]
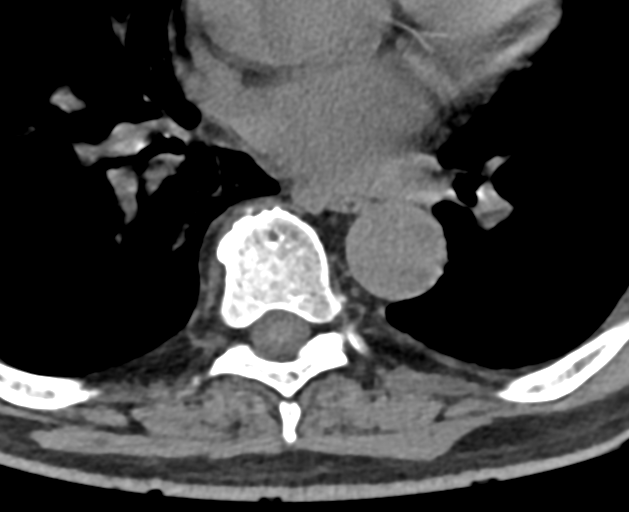
[im 86/172  bone]
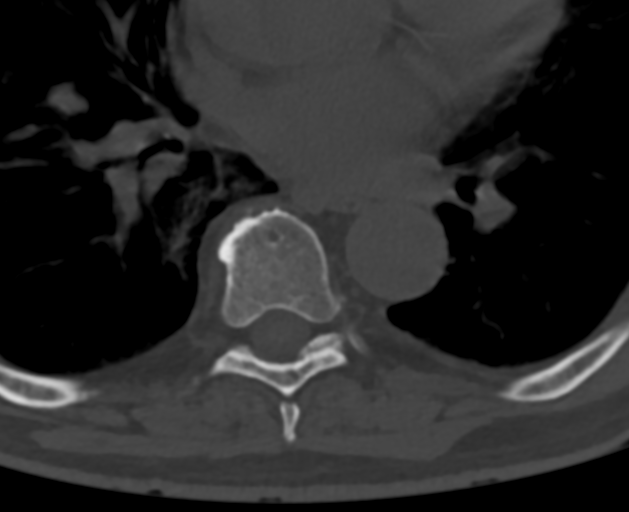

[Series 7: t-spine 2.00 br60 s3 · sagittal · 0.32mm/px · 5 of 102 slices shown (1 of 2)]
[im 34/102  bone]
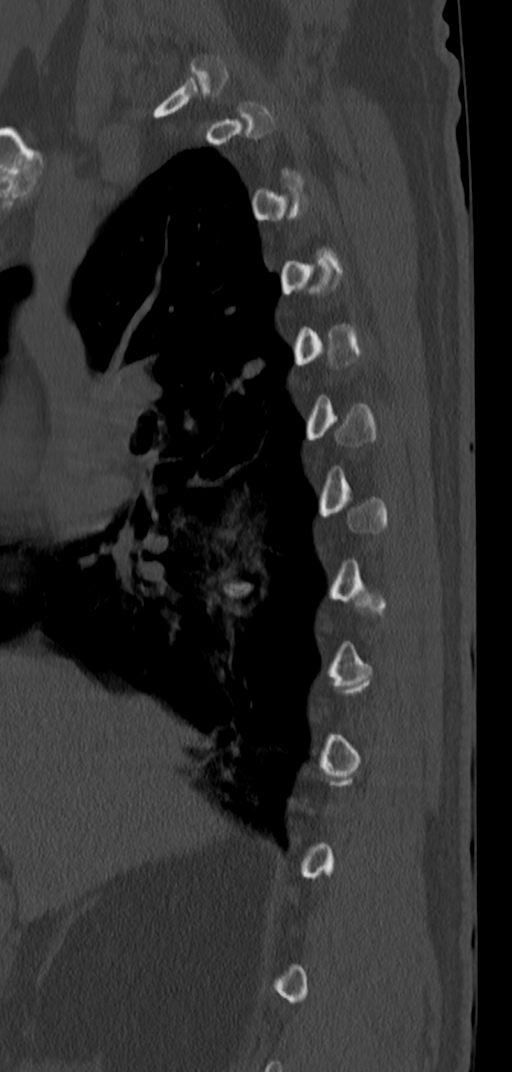
[im 43/102  bone]
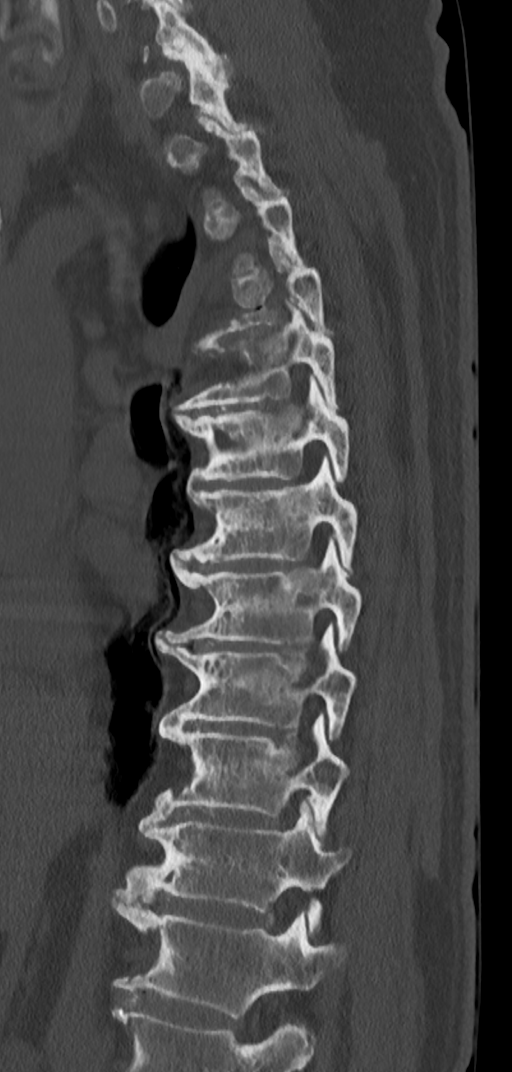
[im 51/102  bone]
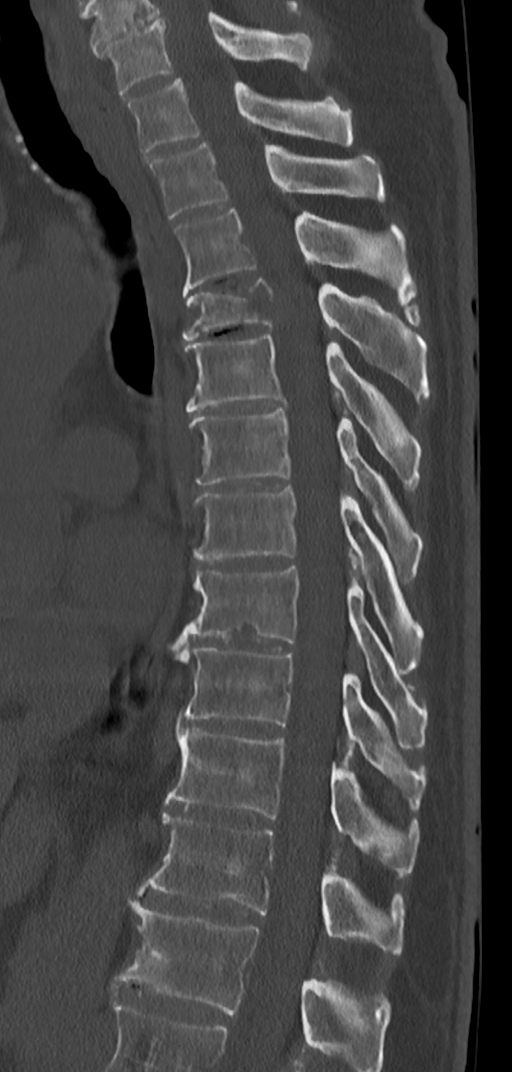
[im 59/102  bone]
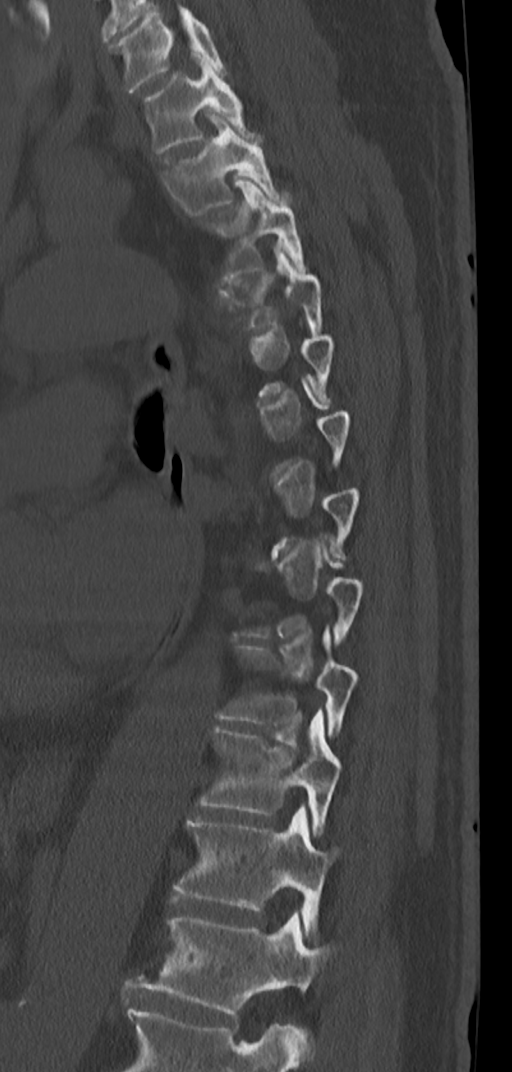
[im 68/102  bone]
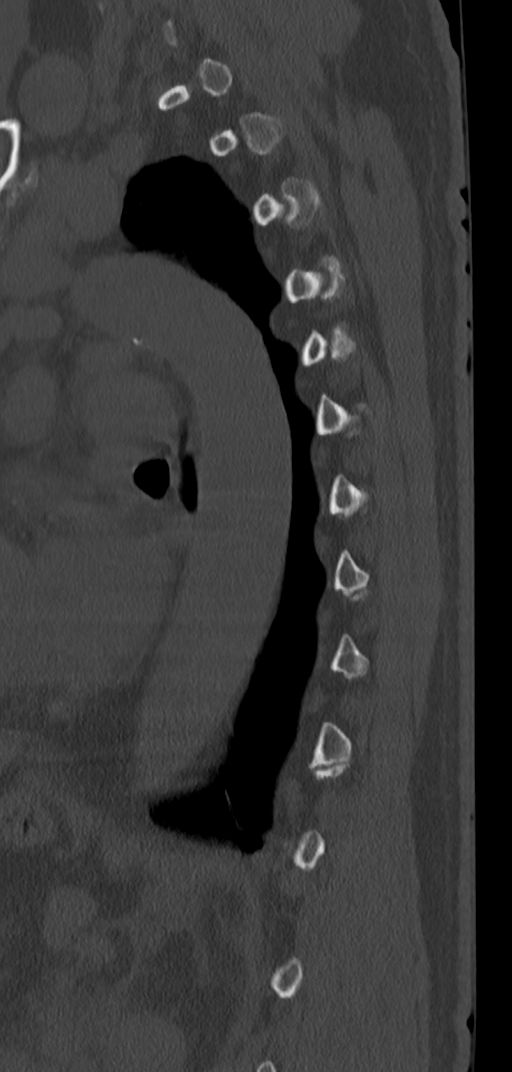

[Series 9: t-spine 2.00 br60 s3 · coronal · 0.40mm/px · 3 of 82 slices shown (2 of 2)]
[im 17/82  bone]
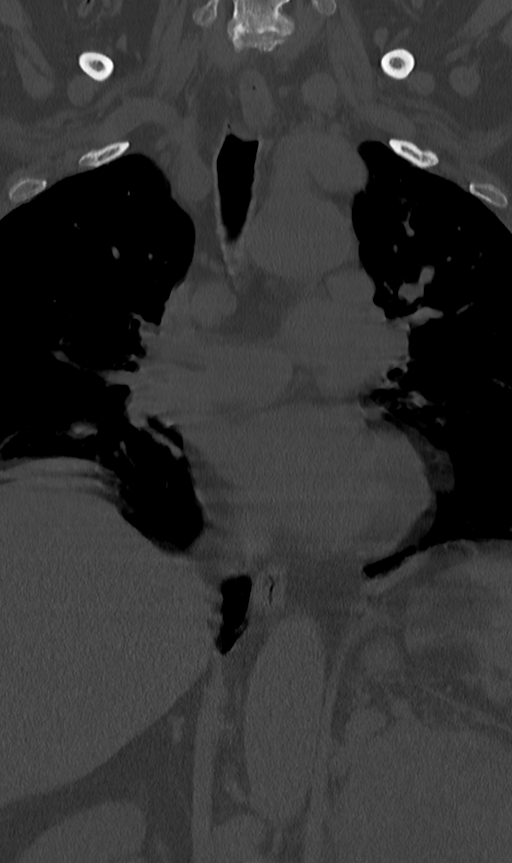
[im 33/82  bone]
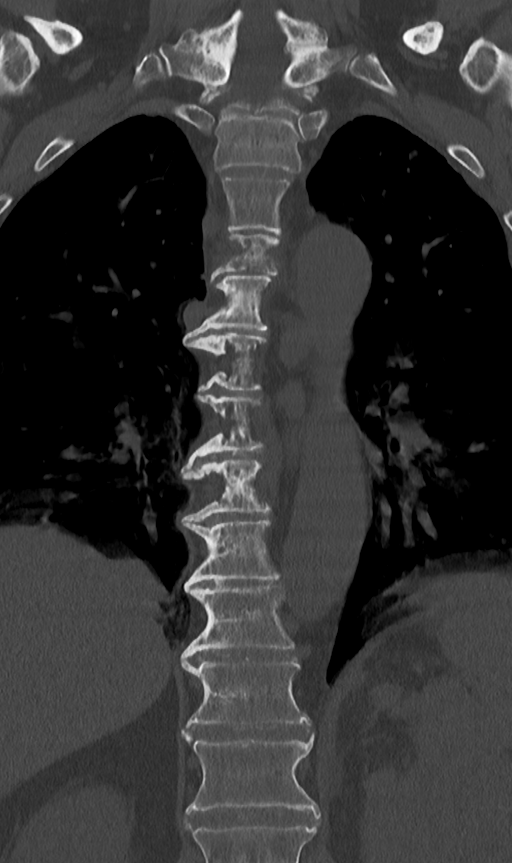
[im 49/82  bone]
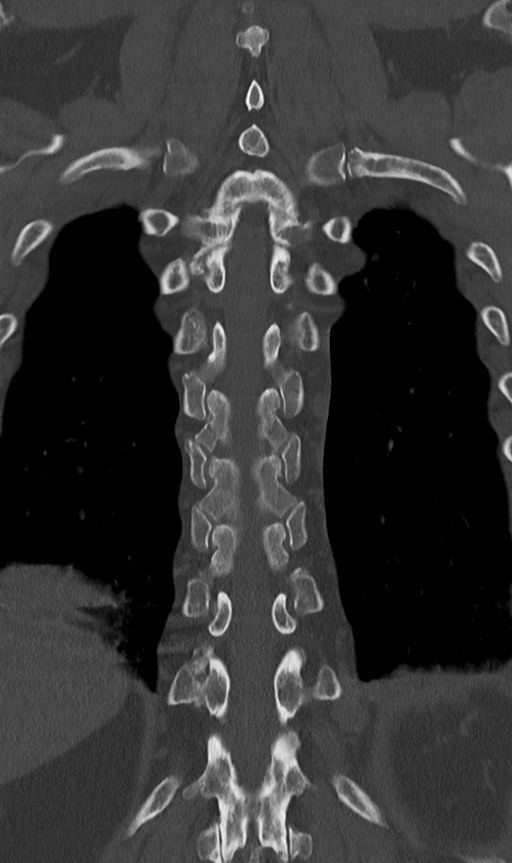

[9 of 33 positions shown; findings below may reference images not displayed]

CT chest abdomen pelvis
report dated 11/09/2021 (CT images not available for comparison).
FINDINGS: Alignment: Normal.

Vertebrae: Lytic destructive lesion centered within the T4 vertebral
body with associated pathologic fracture. Approximately 55%
vertebral body height loss, similar to recent MRI. Retropulsion of
the posterior vertebral body, also similar to prior. Epidural
extension of tumor narrows the thoracic canal with mass effect upon
the thoracic cord (series 3, image 51).

Additional small lytic lesion within the left T4 transverse process.
No additional bone lesions are identified. Remaining vertebral body
heights are maintained without additional fracture.

Paraspinal and other soft tissues: Partially visualized large solid
mass arising from the left kidney measuring at least 10 cm in
diameter. Additional solid nodules adjacent to the left kidney
suggestive of local metastatic disease. Numerous pulmonary nodules
compatible with metastatic disease. Bulky supraclavicular and
mediastinal lymphadenopathy.

Disc levels: Moderate to severe canal stenosis at the T4 level
secondary to pathologic fracture with retropulsion and epidural
tumor. Suspect at least mild bilateral foraminal stenosis at T4-5,
right worse than left. No additional sites of canal stenosis are
evident by CT.
IMPRESSION: 1. Lytic destructive lesion centered within the T4 vertebral body
with associated pathologic fracture, similar to recent MRI, with
approximately 55% vertebral body height loss and mild retropulsion
of the posterior vertebral body. Epidural extension of tumor results
in moderate to severe canal stenosis with mass effect upon the
thoracic cord.
2. Additional small lytic lesion within the left T4 transverse
process.
3. Partially visualized large solid mass arising from the left
kidney measuring at least 10 cm in diameter, compatible with known
renal cell carcinoma.
4. Extensive pulmonary metastatic disease with supraclavicular,
intrathoracic, and retroperitoneal nodal metastasis.

## 2023-07-21 IMAGING — RF DG THORACIC SPINE 2V
1 series · 2 of 2 positions shown · non-contrast
Comparison: 10/27/2021

CLINICAL DATA: T3-T5 decompression, T2-T6 fusion.

EXAM:
THORACIC SPINE 2 VIEWS

[Series 1: run · 2 of 2 slices shown]
[im 1/2]
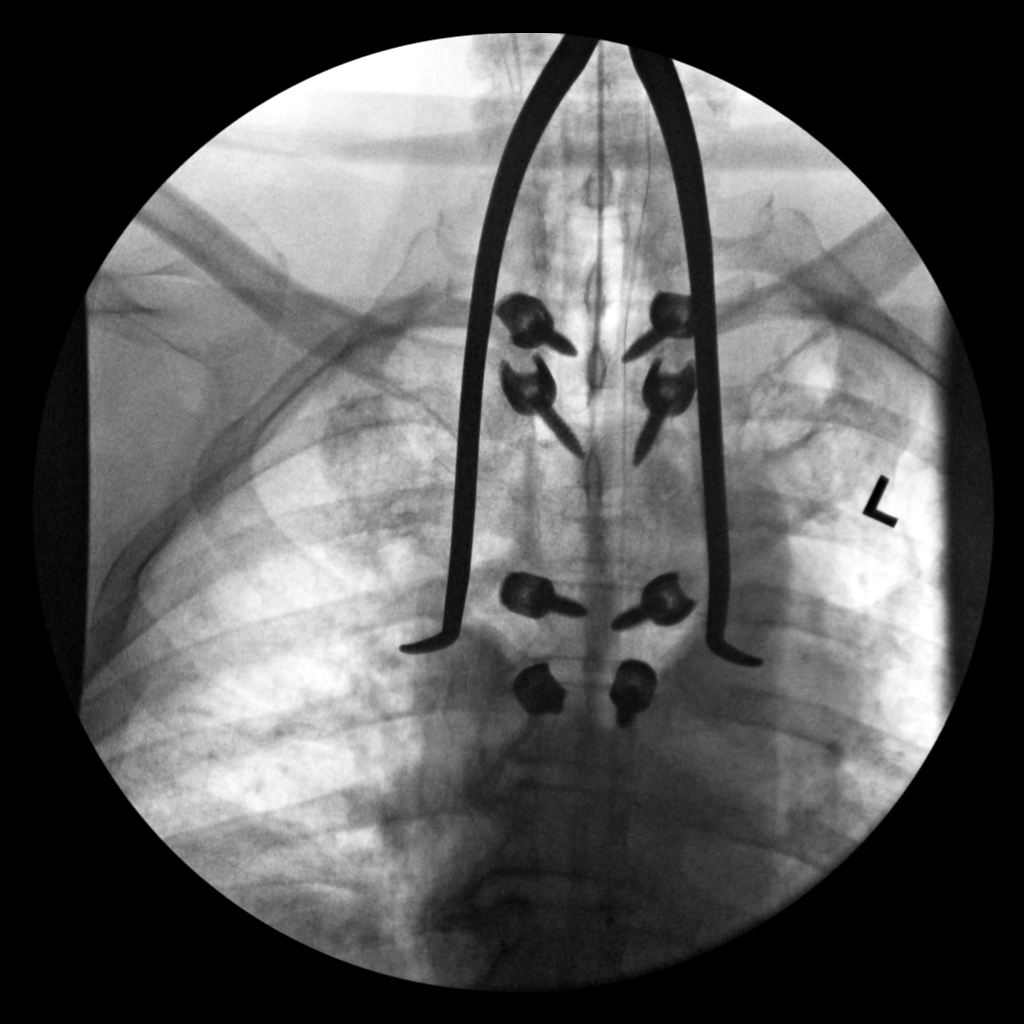
[im 2/2]
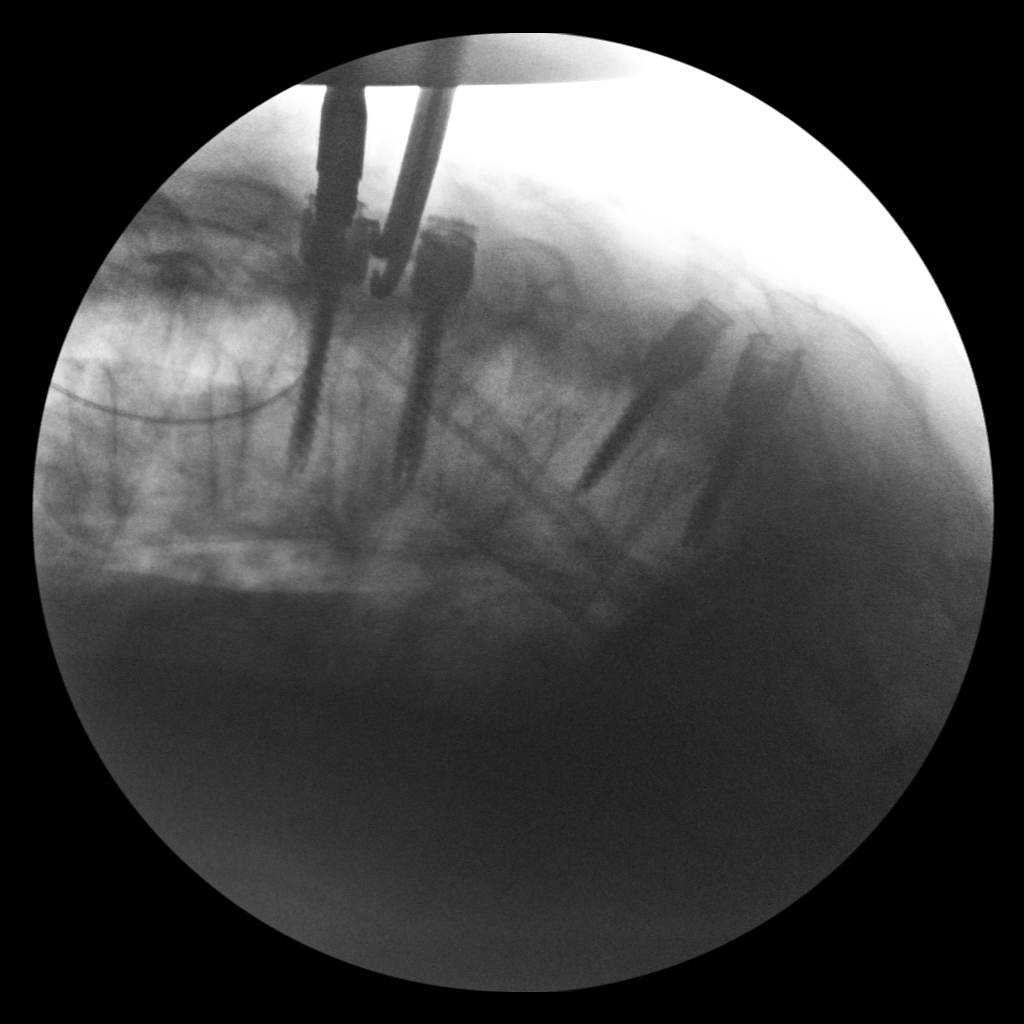

[2 of 2 positions shown; findings below may reference images not displayed]

FINDINGS: Two fluoroscopic images are obtained during the performance of the
procedure and are provided for interpretation only. Images
demonstrate posterior fusion T2-T6, without screws at T4.

Fluoroscopy time: 1 minute, 36 seconds

30.44 mGy
IMPRESSION: Expected intraoperative appearance of T2-T6 fusion and T3-T5
decompression.
